# Patient Record
Sex: Male | Born: 1950
Health system: Southern US, Community
[De-identification: ages and names within clinical notes are randomized; demographics above are authoritative.]

## PROBLEM LIST (undated history)

## (undated) DIAGNOSIS — A770 Spotted fever due to Rickettsia rickettsii: Secondary | ICD-10-CM

## (undated) DIAGNOSIS — Z720 Tobacco use: Secondary | ICD-10-CM

## (undated) DIAGNOSIS — M5090 Cervical disc disorder, unspecified, unspecified cervical region: Secondary | ICD-10-CM

## (undated) HISTORY — PX: TRACHEOSTOMY: SUR1362

## (undated) HISTORY — PX: VASECTOMY: SHX75

## (undated) HISTORY — PX: HERNIA REPAIR: SHX51

## (undated) HISTORY — PX: ORCHIECTOMY: SHX2116

## (undated) SURGERY — Surgical Case
Anesthesia: *Unknown

---

## 2000-06-02 ENCOUNTER — Emergency Department (HOSPITAL_COMMUNITY): Admission: EM | Admit: 2000-06-02 | Discharge: 2000-06-02 | Payer: Self-pay | Admitting: Emergency Medicine

## 2011-09-15 ENCOUNTER — Emergency Department (HOSPITAL_BASED_OUTPATIENT_CLINIC_OR_DEPARTMENT_OTHER)
Admission: EM | Admit: 2011-09-15 | Discharge: 2011-09-15 | Disposition: A | Discharge status: Home / Self Care | Payer: 59 | Attending: Emergency Medicine | Admitting: Emergency Medicine

## 2011-09-15 ENCOUNTER — Encounter (HOSPITAL_BASED_OUTPATIENT_CLINIC_OR_DEPARTMENT_OTHER): Payer: Self-pay | Admitting: *Deleted

## 2011-09-15 DIAGNOSIS — N5089 Other specified disorders of the male genital organs: Secondary | ICD-10-CM

## 2011-09-15 DIAGNOSIS — R21 Rash and other nonspecific skin eruption: Secondary | ICD-10-CM

## 2011-09-15 DIAGNOSIS — N63 Unspecified lump in unspecified breast: Secondary | ICD-10-CM | POA: Insufficient documentation

## 2011-09-15 HISTORY — DX: Spotted fever due to Rickettsia rickettsii: A77.0

## 2011-09-15 LAB — CBC
MCH: 32.9 pg (ref 26.0–34.0)
Platelets: 235 10*3/uL (ref 150–400)
RBC: 4.56 MIL/uL (ref 4.22–5.81)
WBC: 8 10*3/uL (ref 4.0–10.5)

## 2011-09-15 LAB — URINE MICROSCOPIC-ADD ON

## 2011-09-15 LAB — URINALYSIS, ROUTINE W REFLEX MICROSCOPIC
Bilirubin Urine: NEGATIVE
Glucose, UA: NEGATIVE mg/dL
Ketones, ur: NEGATIVE mg/dL
Nitrite: NEGATIVE
pH: 6 (ref 5.0–8.0)

## 2011-09-15 LAB — DIFFERENTIAL
Eosinophils Absolute: 0.3 10*3/uL (ref 0.0–0.7)
Lymphocytes Relative: 16 % (ref 12–46)
Lymphs Abs: 1.3 10*3/uL (ref 0.7–4.0)
Neutro Abs: 5.6 10*3/uL (ref 1.7–7.7)
Neutrophils Relative %: 69 % (ref 43–77)

## 2011-09-15 LAB — COMPREHENSIVE METABOLIC PANEL
ALT: 9 U/L (ref 0–53)
Alkaline Phosphatase: 47 U/L (ref 39–117)
Chloride: 101 mEq/L (ref 96–112)
GFR calc Af Amer: >90 mL/min (ref >90)
Glucose, Bld: 133 mg/dL — ABNORMAL HIGH (ref 70–99)
Potassium: 4 mEq/L (ref 3.5–5.1)
Sodium: 139 mEq/L (ref 135–145)
Total Protein: 6.8 g/dL (ref 6.0–8.3)

## 2011-09-15 LAB — LACTIC ACID, PLASMA: Lactic Acid, Venous: 1.8 mmol/L (ref 0.5–2.2)

## 2011-09-15 LAB — APTT: aPTT: 33 seconds (ref 24–37)

## 2011-09-15 MED ORDER — CLINDAMYCIN PHOSPHATE 900 MG/50ML IV SOLN
900.0000 mg | Freq: Once | INTRAVENOUS | Status: AC
  Administered 2011-09-15: 900 mg via INTRAVENOUS
  Filled 2011-09-15: qty 50

## 2011-09-15 MED ORDER — DOXYCYCLINE HYCLATE 100 MG PO TABS: 100.0000 mg | ORAL_TABLET | Freq: Two times a day (BID) | ORAL | Status: AC

## 2011-09-15 MED ORDER — VANCOMYCIN HCL IN DEXTROSE 1-5 GM/200ML-% IV SOLN
1000.0000 mg | Freq: Once | INTRAVENOUS | Status: AC
  Administered 2011-09-15: 1000 mg via INTRAVENOUS
  Filled 2011-09-15: qty 200

## 2011-09-15 NOTE — Discharge Instructions (Signed)
Rash A rash is a change in the color or texture of your skin. There are many different types of rashes. You may have other problems that accompany your rash. CAUSES   Infections.   Allergic reactions. This can include allergies to pets or foods.   Certain medicines.   Exposure to certain chemicals, soaps, or cosmetics.   Heat.   Exposure to poisonous plants.   Tumors, both cancerous and noncancerous.  SYMPTOMS   Redness.   Scaly skin.   Itchy skin.   Dry or cracked skin.   Bumps.   Blisters.   Pain.  DIAGNOSIS  Your caregiver may do a physical exam to determine what type of rash you have. A skin sample (biopsy) may be taken and examined under a microscope. TREATMENT  Treatment depends on the type of rash you have. Your caregiver may prescribe certain medicines. For serious conditions, you may need to see a skin doctor (dermatologist). HOME CARE INSTRUCTIONS   Avoid the substance that caused your rash.   Do not scratch your rash. This can cause infection.   You may take cool baths to help stop itching.   Only take over-the-counter or prescription medicines as directed by your caregiver.   Keep all follow-up appointments as directed by your caregiver.  SEEK IMMEDIATE MEDICAL CARE IF:  You have increasing pain, swelling, or redness.   You have a fever.   You have new or severe symptoms.   You have body aches, diarrhea, or vomiting.   Your rash is not better after 3 days.  MAKE SURE YOU:  Understand these instructions.   Will watch your condition.   Will get help right away if you are not doing well or get worse.  Document Released: 03/27/2002 Document Revised: 03/26/2011 Document Reviewed: 01/19/2011 [facility] Patient Information 2012 [place], [place].Scrotal Swelling Scrotal swelling may occur on one or both sides of the scrotum. Pain may also occur with swelling. Early evaluation with your caregiver and treatment is important.  CAUSES   Injury.    Infection.   An ingrown hair or abrasion in the area.   Repeated rubbing from tight-fitting underwear.   Poor hygiene.   A weakened area in the muscles around the groin (hernia). A hernia can allow abdominal contents to push into the scrotum.   Fluid around the testicle (hydrocele).   Enlarged vein around the testicle (varicocele).   Certain medical treatments or existing conditions.   A recent genital surgery or procedure.   The spermatic cord becomes twisted in the scrotum, which cuts off blood supply (testicular torsion).   Testicular cancer.  DIAGNOSIS A physical exam and medical history may be performed. You will be asked questions about recent activity and where and when the swelling began. Further tests may be performed to confirm the diagnosis, such as an imaging test (ultrasound or MRI). TREATMENT Treatment will depend on the cause of the swelling. Treatment may include:  Self care (rest, ice, limiting activity, scrotal support).   Pain medicine.   Taking medicines to treat an infection.   Surgery or help from a specialist (urologist).  HOME CARE INSTRUCTIONS  Rest and limit activity until the swelling goes away. Lying down is the preferred position.   Put ice on the scrotum.   Put ice in a plastic bag.   Place a towel between your skin and the bag.   Leave the ice on for 15 to 20 minutes, 3 to 4 times a day for 1 to 2 days.  Place a rolled towel under the testicles for support.   Wear loose-fitting clothing or an athletic support cup for comfort.   Take all medicines as directed by your caregiver.   Perform a monthly self-exam of the scrotum and penis. Feel for changes. Ask your caregiver how to perform a monthly self-exam if you are unsure.  There are a few things you can do to help prevent injury or infection that can lead to swelling:  Wear an athletic support and protective cup during sports activities.   Practice safe sex. Wear condoms.    Follow all of your caregiver's recommendations after a surgical procedure. Discuss appropriate time frames for healing and bedrest with your surgeon.   Get vaccinated against mumps, measles, and rubella (MMR).   Use good body mechanics during activity and when lifting heavy objects. Avoid bearing down or straining.   Drink enough fluids to keep your urine clear or pale yellow. Always empty the bladder completely when urinating to avoid infection.  Finding out the results of your test Ask when your test results will be ready. Make sure you get your test results. SEEK MEDICAL CARE IF:  You have a sudden (acute) onset of pain that is persistent and not improving.   You notice a heavy feeling or fluid in the scrotum.   You have pain or burning while urinating.   You have blood in the urine or semen.   You feel a lump around the testicle.   You notice that one testicle is larger than the other (slight variation is normal).   You have a persistent dull ache or pain in the groin or scrotum.   You need instruction on performing a monthly self-exam.  SEEK IMMEDIATE MEDICAL CARE IF:  The pain does not go away or becomes severe.   You have a fever.   You have pain or vomiting that cannot be controlled.   You notice significant redness or swelling of one or both sides of the scrotum.  MAKE SURE YOU:  Understand these instructions.   Will watch your condition.   Will get help right away if you are not doing well or get worse.  Document Released: 05/09/2010 Document Revised: 03/26/2011 Document Reviewed: 05/09/2010 [facility] Patient Information 2012 [place], [place].

## 2011-09-15 NOTE — ED Provider Notes (Signed)
History     CSN: [id number]  Arrival date & time 09/15/11  1746   First MD Initiated Contact with Patient 09/15/11 1858      Chief Complaint  Patient presents with  . Skin Problem    (Consider location/radiation/quality/duration/timing/severity/associated sxs/prior treatment) HPI Comments: Patient complains of 2 to three-day history of swelling in his scrotal area. It started about 3 days ago and is currently gotten worse. Now. His entire scrotal area and penis is diffusely swollen. He's had some clear drainage from the site. Denies any pain to the area. Denies a known fevers. Yesterday. He started noticing some swelling and tenderness to his nipples with some erythema to his nipples. He's also noticed a rash that started 2 days ago as well. It's nonpruritic. Denies a known fevers. Denies he nausea, vomiting. Denies any known bites or stings other than he did pull a tick off about a week ago. Denies any cough, congestion, shortness of breath, headaches, abdominal pain, or other complaints. He has a history of swelling in his penis and scrotal area in the past  one time and was told he possibly had a spider bite. Was given antibiotics and went away. He saw Dr. Patsi Sears for this  The history is provided by the patient.    Past Medical History  Diagnosis Date  . [place] spotted fever     Past Surgical History  Procedure Date  . Tracheotomy     No family history on file.  History  Substance Use Topics  . Smoking status: Current Everyday Smoker -- 1.0 packs/day  . Smokeless tobacco: Not on file  . Alcohol Use: No      Review of Systems  Constitutional: Negative for fever, chills, diaphoresis and fatigue.  HENT: Negative for congestion, rhinorrhea and sneezing.   Eyes: Negative.   Respiratory: Negative for cough, chest tightness and shortness of breath.   Cardiovascular: Negative for chest pain and leg swelling.  Gastrointestinal: Negative for nausea, vomiting,  abdominal pain, diarrhea and blood in stool.  Genitourinary: Positive for penile swelling and scrotal swelling. Negative for frequency, hematuria, flank pain, difficulty urinating, genital sores and testicular pain.  Musculoskeletal: Negative for back pain and arthralgias.  Skin: Positive for rash.  Neurological: Negative for dizziness, speech difficulty, weakness, numbness and headaches.    Allergies  Penicillins  Home Medications   Current Outpatient Rx  Name Route Sig Dispense Refill  . DOXYCYCLINE HYCLATE 100 MG PO TABS Oral Take 1 tablet (100 mg total) by mouth 2 (two) times daily. 28 tablet 0    BP 102/79  Pulse 88  Temp(Src) 98.4 F (36.9 C) (Oral)  Resp 18  SpO2 95%  Physical Exam  Constitutional: He is oriented to person, place, and time. He appears well-developed and well-nourished.  HENT:  Head: Normocephalic and atraumatic.  Eyes: Pupils are equal, round, and reactive to light.  Neck: Normal range of motion. Neck supple.  Cardiovascular: Normal rate, regular rhythm and normal heart sounds.   Pulmonary/Chest: Effort normal and breath sounds normal. No respiratory distress. He has no wheezes. He has no rales. He exhibits no tenderness.       He has diffuse swelling to both nipples with firmness on palpation. There is no fluctuance. No obvious drainage from the nipples. There erythematous. Mild erythema around the nipples.  Abdominal: Soft. Bowel sounds are normal. There is no tenderness. There is no rebound and no guarding.  Genitourinary:       He has diffuse significant  swelling to the scrotum and the penis. He is circumcised. Swelling is diffuse. There is no tenderness to palpation. Serous drainage is noted  Musculoskeletal: Normal range of motion. He exhibits no edema.  Lymphadenopathy:    He has no cervical adenopathy.  Neurological: He is alert and oriented to person, place, and time.  Skin: Skin is warm and dry. Rash noted.       He has a diffuse macular-type  rash. He also has some petechiae to his lower extremities. No vesicular lesions are noted. No purpura is noted. No rash to the palms or soles  Psychiatric: He has a normal mood and affect.    ED Course  Procedures (including critical care time) Results for orders placed during the hospital encounter of 09/15/11  CBC      Component Value Range   WBC 8.0  4.0 - 10.5 (K/uL)   RBC 4.56  4.22 - 5.81 (MIL/uL)   Hemoglobin 15.0  13.0 - 17.0 (g/dL)   HCT 16.1  09.6 - 04.5 (%)   MCV 93.9  78.0 - 100.0 (fL)   MCH 32.9  26.0 - 34.0 (pg)   MCHC 35.0  30.0 - 36.0 (g/dL)   RDW 40.9  81.1 - 91.4 (%)   Platelets 235  150 - 400 (K/uL)  DIFFERENTIAL      Component Value Range   Neutrophils Relative 69  43 - 77 (%)   Neutro Abs 5.6  1.7 - 7.7 (K/uL)   Lymphocytes Relative 16  12 - 46 (%)   Lymphs Abs 1.3  0.7 - 4.0 (K/uL)   Monocytes Relative 10  3 - 12 (%)   Monocytes Absolute 0.8  0.1 - 1.0 (K/uL)   Eosinophils Relative 3  0 - 5 (%)   Eosinophils Absolute 0.3  0.0 - 0.7 (K/uL)   Basophils Relative 0  0 - 1 (%)   Basophils Absolute 0.0  0.0 - 0.1 (K/uL)  COMPREHENSIVE METABOLIC PANEL      Component Value Range   Sodium 139  135 - 145 (mEq/L)   Potassium 4.0  3.5 - 5.1 (mEq/L)   Chloride 101  96 - 112 (mEq/L)   CO2 29  19 - 32 (mEq/L)   Glucose, Bld 133 (*) 70 - 99 (mg/dL)   BUN 12  6 - 23 (mg/dL)   Creatinine, Ser 7.82  0.50 - 1.35 (mg/dL)   Calcium 9.6  8.4 - 95.6 (mg/dL)   Total Protein 6.8  6.0 - 8.3 (g/dL)   Albumin 3.8  3.5 - 5.2 (g/dL)   AST 19  0 - 37 (U/L)   ALT 9  0 - 53 (U/L)   Alkaline Phosphatase 47  39 - 117 (U/L)   Total Bilirubin 0.3  0.3 - 1.2 (mg/dL)   GFR calc non Af Amer >90  >90 (mL/min)   GFR calc Af Amer >90  >90 (mL/min)  URINALYSIS, ROUTINE W REFLEX MICROSCOPIC      Component Value Range   Color, Urine YELLOW  YELLOW    APPearance CLEAR  CLEAR    Specific Gravity, Urine 1.025  1.005 - 1.030    pH 6.0  5.0 - 8.0    Glucose, UA NEGATIVE  NEGATIVE (mg/dL)    Hgb urine dipstick NEGATIVE  NEGATIVE    Bilirubin Urine NEGATIVE  NEGATIVE    Ketones, ur NEGATIVE  NEGATIVE (mg/dL)   Protein, ur NEGATIVE  NEGATIVE (mg/dL)   Urobilinogen, UA 1.0  0.0 - 1.0 (mg/dL)  Nitrite NEGATIVE  NEGATIVE    Leukocytes, UA TRACE (*) NEGATIVE   PROTIME-INR      Component Value Range   Prothrombin Time 14.0  11.6 - 15.2 (seconds)   INR 1.06  0.00 - 1.49   APTT      Component Value Range   aPTT 33  24 - 37 (seconds)  LACTIC ACID, PLASMA      Component Value Range   Lactic Acid, Venous 1.8  0.5 - 2.2 (mmol/L)  URINE MICROSCOPIC-ADD ON      Component Value Range   Squamous Epithelial / LPF RARE  RARE    WBC, UA 0-2  <3 (WBC/hpf)   Bacteria, UA RARE  RARE    No results found.   1. Rash   2. Scrotal swelling       MDM  Pt has a bizarre constellation of symptoms including diffuse scrotal swelling with penile swelling, rash, and nipple swelling/discharge, similar to mastitis.  No pain to srotum suggestive of necrotizing fasciitis or torsion.  No other edema to suggest anasarca.  Pt has similar episode about 2 years ago, saw Dr. Patsi Sears and was given shot of abx, he had a rash similar to todays rash that developed after that which he attributed to PCN, but in retrospect seems similar to this episode.  Pt is not febrile, no elevated WBC, no evidence of sepsis.  Well appearing.  I spoke with Dr. Patsi Sears who did not really know what to make of other symptoms, but can f/u pt regarding scrotal swelling.  I spoke with Dr. Tye [place] with infectious disease who was not sure what was going on, but will f/u pt in office.  Not sure if this is infectious or an autoimmune dz.  Will tx for RMSF pending titer.  Encouraged pt to f/u with Dr. Patsi Sears and ID.  If ID does not make appt within 2 days, advised pt to f/u with his PMD at [facility] in [place].  Advised to return here if symptoms worsen at all.        Rolan Bucco, MD 09/15/11 2202

## 2011-09-15 NOTE — ED Notes (Signed)
Dr Fredderick Phenix at bedside discussing test results and f/u care with pt.

## 2011-09-15 NOTE — ED Notes (Signed)
Rash over his body x 3 days. Infection to his nipples and groin.

## 2011-09-17 ENCOUNTER — Encounter: Payer: Self-pay | Admitting: Internal Medicine

## 2011-09-17 ENCOUNTER — Ambulatory Visit (INDEPENDENT_AMBULATORY_CARE_PROVIDER_SITE_OTHER): Payer: 59 | Admitting: Internal Medicine

## 2011-09-17 VITALS — BP 136/86 | HR 79 | Temp 98.6°F | Ht 76.5 in | Wt 197.0 lb

## 2011-09-17 DIAGNOSIS — R21 Rash and other nonspecific skin eruption: Secondary | ICD-10-CM

## 2011-09-17 LAB — ROCKY MTN SPOTTED FVR AB, IGM-BLOOD: RMSF IgM: 0.3 IV (ref 0.00–0.89)

## 2011-09-17 LAB — ROCKY MTN SPOTTED FVR AB, IGG-BLOOD: RMSF IgG: 7.72 IV — ABNORMAL HIGH

## 2011-09-17 NOTE — Progress Notes (Signed)
  Subjective:    Patient ID: Jeremiah Carson, male    DOB: 05-22-1950, 61 y.o.   MRN: [id number]  HPI Evaluation of a rash. He was seen in the emergency room 2 days ago with concern of a diffuse rash and also notable scrotal swelling. He also had a similar constellation of symptoms about 2 years ago and at that time was seen by his urologist. He did not know the diagnosis but thought he might have had an infection of some point and in fact was given a dose of IM penicillin and it resolved all his symptoms. This time he had scrotal and penile swelling that is nontender and no discharge. He had some mild nipple swelling with a surrounding macular rash. He also now has a rash that is pinpoint spread throughout his body including his palms and his feet. He works as a [employer]. He was given a course of doxycycline by the emergency room though there is no indication of what this was being used for. There is no signs of infection by the labs and there is no syphilis test done by the emergency room. He also has appointment with his urologist today. He denies fever, denies headache, denies dysuria.   Review of Systems  Constitutional: Negative.   HENT: Negative for sore throat and trouble swallowing.   Cardiovascular: Negative for chest pain, palpitations and leg swelling.  Gastrointestinal: Negative for nausea, vomiting, abdominal pain and diarrhea.  Genitourinary: Positive for penile swelling and scrotal swelling. Negative for hematuria, discharge, genital sores and penile pain.       Penile and scrotal swelling, nontender, no rash or discharge or  Musculoskeletal: Negative.   Skin:       Diffuse maculopapular rash including on the palms Over the nipples she does have surrounding erythema and palpable lymphadenopathy  Neurological: Negative.   Hematological: Negative for adenopathy.       Objective:   Physical Exam  Constitutional: He appears well-developed and well-nourished. No distress.    Cardiovascular: Normal rate, regular rhythm and normal heart sounds.  Exam reveals no gallop and no friction rub.   No murmur heard. Pulmonary/Chest: Effort normal and breath sounds normal. No respiratory distress. He has no wheezes. He has no rales.  Abdominal: Soft. Bowel sounds are normal. He exhibits no distension. There is no tenderness. There is no rebound.  Genitourinary:       Large scrotal and penile swelling  Skin:       Diffuse pressure cuff with her rash including the palms and surrounding the nipple.  Psychiatric: He has a normal mood and affect. His behavior is normal.          Assessment & Plan:

## 2011-09-17 NOTE — Assessment & Plan Note (Addendum)
A diffuse rash with penile swelling in history of treatment with penicillin and resolution of symptoms certainly brings up the concern of syphilis. He is on doxycycline for an unknown reason though this certainly will treat secondary syphilis in somebody with a penicillin allergy. There was no syphilis testing done in the emergency room however I will send that today including gonorrhea, Chlamydia and HIV. He is to see the urologist today as well. If these tests are negative, I would defer any other management to the urologist. He may need a scrotal ultrasound but since he is seen the urologist I will defer this I do not think this is [facility] spotted fever as it is no tick exposure, no fever, no headache.

## 2011-09-18 NOTE — ED Notes (Signed)
+  [place] Spotted Fever. Patient treated with Doxycycline. Per protocol MD. [facility] faxed.

## 2011-09-22 LAB — CULTURE, BLOOD (ROUTINE X 2): Culture  Setup Time: 201305290156

## 2014-01-11 ENCOUNTER — Other Ambulatory Visit: Payer: Self-pay | Admitting: Family Medicine

## 2014-01-11 ENCOUNTER — Ambulatory Visit
Admission: RE | Admit: 2014-01-11 | Discharge: 2014-01-11 | Disposition: A | Discharge status: Home / Self Care | Payer: 59 | Source: Ambulatory Visit | Attending: Family Medicine | Admitting: Family Medicine

## 2014-01-11 DIAGNOSIS — M5412 Radiculopathy, cervical region: Secondary | ICD-10-CM

## 2014-01-11 DIAGNOSIS — R29898 Other symptoms and signs involving the musculoskeletal system: Secondary | ICD-10-CM

## 2014-01-12 ENCOUNTER — Other Ambulatory Visit: Payer: Self-pay | Admitting: Family Medicine

## 2014-01-12 DIAGNOSIS — M5412 Radiculopathy, cervical region: Secondary | ICD-10-CM

## 2014-01-19 ENCOUNTER — Ambulatory Visit
Admission: RE | Admit: 2014-01-19 | Discharge: 2014-01-19 | Disposition: A | Discharge status: Home / Self Care | Payer: 59 | Source: Ambulatory Visit | Attending: Family Medicine | Admitting: Family Medicine

## 2014-01-19 DIAGNOSIS — M5412 Radiculopathy, cervical region: Secondary | ICD-10-CM

## 2014-01-19 MED ORDER — GADOBENATE DIMEGLUMINE 529 MG/ML IV SOLN
18.0000 mL | Freq: Once | INTRAVENOUS | Status: AC | PRN
  Administered 2014-01-19: 18 mL via INTRAVENOUS

## 2016-05-24 ENCOUNTER — Inpatient Hospital Stay (HOSPITAL_COMMUNITY)
Admission: EM | Admit: 2016-05-24 | Discharge: 2016-05-28 | DRG: 871 | Disposition: A | Discharge status: Home / Self Care | Payer: 59 | Attending: Family Medicine | Admitting: Family Medicine

## 2016-05-24 ENCOUNTER — Encounter (HOSPITAL_COMMUNITY)
Admission: EM | Disposition: A | Discharge status: Home / Self Care | Payer: Self-pay | Source: Home / Self Care | Attending: Family Medicine

## 2016-05-24 ENCOUNTER — Emergency Department (HOSPITAL_COMMUNITY): Payer: 59

## 2016-05-24 ENCOUNTER — Encounter (HOSPITAL_COMMUNITY): Payer: Self-pay | Admitting: Emergency Medicine

## 2016-05-24 DIAGNOSIS — I214 Non-ST elevation (NSTEMI) myocardial infarction: Secondary | ICD-10-CM

## 2016-05-24 DIAGNOSIS — J069 Acute upper respiratory infection, unspecified: Secondary | ICD-10-CM | POA: Diagnosis present

## 2016-05-24 DIAGNOSIS — R350 Frequency of micturition: Secondary | ICD-10-CM | POA: Diagnosis present

## 2016-05-24 DIAGNOSIS — I4892 Unspecified atrial flutter: Secondary | ICD-10-CM | POA: Diagnosis present

## 2016-05-24 DIAGNOSIS — I248 Other forms of acute ischemic heart disease: Secondary | ICD-10-CM | POA: Diagnosis present

## 2016-05-24 DIAGNOSIS — A419 Sepsis, unspecified organism: Secondary | ICD-10-CM | POA: Diagnosis present

## 2016-05-24 DIAGNOSIS — J189 Pneumonia, unspecified organism: Secondary | ICD-10-CM | POA: Diagnosis present

## 2016-05-24 DIAGNOSIS — R509 Fever, unspecified: Secondary | ICD-10-CM

## 2016-05-24 DIAGNOSIS — Z720 Tobacco use: Secondary | ICD-10-CM

## 2016-05-24 DIAGNOSIS — R000 Tachycardia, unspecified: Secondary | ICD-10-CM | POA: Diagnosis present

## 2016-05-24 DIAGNOSIS — J441 Chronic obstructive pulmonary disease with (acute) exacerbation: Secondary | ICD-10-CM | POA: Diagnosis present

## 2016-05-24 DIAGNOSIS — I4891 Unspecified atrial fibrillation: Secondary | ICD-10-CM | POA: Diagnosis present

## 2016-05-24 DIAGNOSIS — Z801 Family history of malignant neoplasm of trachea, bronchus and lung: Secondary | ICD-10-CM | POA: Diagnosis not present

## 2016-05-24 DIAGNOSIS — Z91199 Patient's noncompliance with other medical treatment and regimen due to unspecified reason: Secondary | ICD-10-CM

## 2016-05-24 DIAGNOSIS — R0902 Hypoxemia: Secondary | ICD-10-CM | POA: Diagnosis present

## 2016-05-24 DIAGNOSIS — R0600 Dyspnea, unspecified: Secondary | ICD-10-CM | POA: Diagnosis not present

## 2016-05-24 DIAGNOSIS — R0602 Shortness of breath: Secondary | ICD-10-CM

## 2016-05-24 DIAGNOSIS — R197 Diarrhea, unspecified: Secondary | ICD-10-CM | POA: Diagnosis present

## 2016-05-24 DIAGNOSIS — Z880 Allergy status to penicillin: Secondary | ICD-10-CM | POA: Diagnosis not present

## 2016-05-24 DIAGNOSIS — K921 Melena: Secondary | ICD-10-CM | POA: Diagnosis present

## 2016-05-24 DIAGNOSIS — F17210 Nicotine dependence, cigarettes, uncomplicated: Secondary | ICD-10-CM | POA: Diagnosis present

## 2016-05-24 DIAGNOSIS — A770 Spotted fever due to Rickettsia rickettsii: Secondary | ICD-10-CM | POA: Diagnosis present

## 2016-05-24 DIAGNOSIS — I959 Hypotension, unspecified: Secondary | ICD-10-CM | POA: Diagnosis not present

## 2016-05-24 DIAGNOSIS — Z9119 Patient's noncompliance with other medical treatment and regimen: Secondary | ICD-10-CM | POA: Diagnosis not present

## 2016-05-24 HISTORY — DX: Cervical disc disorder, unspecified, unspecified cervical region: M50.90

## 2016-05-24 HISTORY — DX: Tobacco use: Z72.0

## 2016-05-24 LAB — CBC
HCT: 38.6 % — ABNORMAL LOW (ref 39.0–52.0)
Hemoglobin: 12.3 g/dL — ABNORMAL LOW (ref 13.0–17.0)
MCH: 31.1 pg (ref 26.0–34.0)
MCHC: 31.9 g/dL (ref 30.0–36.0)
MCV: 97.7 fL (ref 78.0–100.0)
PLATELETS: 344 10*3/uL (ref 150–400)
RBC: 3.95 MIL/uL — ABNORMAL LOW (ref 4.22–5.81)
RDW: 13 % (ref 11.5–15.5)
WBC: 18.4 10*3/uL — AB (ref 4.0–10.5)

## 2016-05-24 LAB — CBC WITH DIFFERENTIAL/PLATELET
Basophils Absolute: 0 10*3/uL (ref 0.0–0.1)
Basophils Relative: 0 %
EOS ABS: 0 10*3/uL (ref 0.0–0.7)
Eosinophils Relative: 0 %
HCT: 41.6 % (ref 39.0–52.0)
HEMOGLOBIN: 13.1 g/dL (ref 13.0–17.0)
LYMPHS PCT: 8 %
Lymphs Abs: 1.4 10*3/uL (ref 0.7–4.0)
MCH: 30.9 pg (ref 26.0–34.0)
MCHC: 31.5 g/dL (ref 30.0–36.0)
MCV: 98.1 fL (ref 78.0–100.0)
MONO ABS: 1.7 10*3/uL — AB (ref 0.1–1.0)
Monocytes Relative: 10 %
NEUTROS ABS: 13.9 10*3/uL — AB (ref 1.7–7.7)
Neutrophils Relative %: 82 %
PLATELETS: 342 10*3/uL (ref 150–400)
RBC: 4.24 MIL/uL (ref 4.22–5.81)
RDW: 13 % (ref 11.5–15.5)
WBC: 17 10*3/uL — ABNORMAL HIGH (ref 4.0–10.5)

## 2016-05-24 LAB — CREATININE, SERUM: CREATININE: 0.78 mg/dL (ref 0.61–1.24)

## 2016-05-24 LAB — COMPREHENSIVE METABOLIC PANEL WITH GFR
ALT: 19 U/L (ref 17–63)
AST: 27 U/L (ref 15–41)
Albumin: 3.1 g/dL — ABNORMAL LOW (ref 3.5–5.0)
Alkaline Phosphatase: 62 U/L (ref 38–126)
Anion gap: 13 (ref 5–15)
BUN: 16 mg/dL (ref 6–20)
CO2: 31 mmol/L (ref 22–32)
Calcium: 9.2 mg/dL (ref 8.9–10.3)
Chloride: 91 mmol/L — ABNORMAL LOW (ref 101–111)
Creatinine, Ser: 0.79 mg/dL (ref 0.61–1.24)
GFR calc Af Amer: >60 mL/min
GFR calc non Af Amer: >60 mL/min
Glucose, Bld: 127 mg/dL — ABNORMAL HIGH (ref 65–99)
Potassium: 4.1 mmol/L (ref 3.5–5.1)
Sodium: 135 mmol/L (ref 135–145)
Total Bilirubin: 0.6 mg/dL (ref 0.3–1.2)
Total Protein: 6.9 g/dL (ref 6.5–8.1)

## 2016-05-24 LAB — URINALYSIS, ROUTINE W REFLEX MICROSCOPIC
Bacteria, UA: NONE SEEN
Bilirubin Urine: NEGATIVE
Glucose, UA: NEGATIVE mg/dL
Hgb urine dipstick: NEGATIVE
Ketones, ur: 5 mg/dL — AB
Leukocytes, UA: NEGATIVE
Nitrite: NEGATIVE
Protein, ur: 100 mg/dL — AB
Specific Gravity, Urine: 1.029 (ref 1.005–1.030)
Squamous Epithelial / HPF: NONE SEEN
pH: 5 (ref 5.0–8.0)

## 2016-05-24 LAB — BRAIN NATRIURETIC PEPTIDE: B NATRIURETIC PEPTIDE 5: 268.3 pg/mL — AB (ref 0.0–100.0)

## 2016-05-24 LAB — I-STAT ARTERIAL BLOOD GAS, ED
ACID-BASE EXCESS: 8 mmol/L — AB (ref 0.0–2.0)
BICARBONATE: 33.4 mmol/L — AB (ref 20.0–28.0)
O2 Saturation: 94 %
PCO2 ART: 50.3 mmHg — AB (ref 32.0–48.0)
PH ART: 7.43 (ref 7.350–7.450)
PO2 ART: 71 mmHg — AB (ref 83.0–108.0)
Patient temperature: 99
TCO2: 35 mmol/L (ref 0–100)

## 2016-05-24 LAB — TSH: TSH: 0.437 u[IU]/mL (ref 0.350–4.500)

## 2016-05-24 LAB — MAGNESIUM: Magnesium: 1.7 mg/dL (ref 1.7–2.4)

## 2016-05-24 LAB — I-STAT CG4 LACTIC ACID, ED: Lactic Acid, Venous: 1.52 mmol/L (ref 0.5–1.9)

## 2016-05-24 LAB — TROPONIN I
TROPONIN I: 0.05 ng/mL — AB (ref <0.03)
Troponin I: 0.07 ng/mL (ref <0.03)

## 2016-05-24 LAB — MRSA PCR SCREENING: MRSA by PCR: NEGATIVE

## 2016-05-24 LAB — LACTIC ACID, PLASMA: LACTIC ACID, VENOUS: 1.5 mmol/L (ref 0.5–1.9)

## 2016-05-24 LAB — INFLUENZA PANEL BY PCR (TYPE A & B)
INFLAPCR: NEGATIVE
INFLBPCR: NEGATIVE

## 2016-05-24 LAB — PHOSPHORUS: Phosphorus: 3.8 mg/dL (ref 2.5–4.6)

## 2016-05-24 SURGERY — LEFT HEART CATH AND CORONARY ANGIOGRAPHY
Anesthesia: LOCAL

## 2016-05-24 MED ORDER — ENOXAPARIN SODIUM 80 MG/0.8ML ~~LOC~~ SOLN
80.0000 mg | Freq: Once | SUBCUTANEOUS | Status: AC
  Administered 2016-05-24: 18:00:00 80 mg via SUBCUTANEOUS
  Filled 2016-05-24: qty 0.8

## 2016-05-24 MED ORDER — SODIUM CHLORIDE 0.9 % IV BOLUS (SEPSIS): 1000.0000 mL | Freq: Once | INTRAVENOUS | Status: DC

## 2016-05-24 MED ORDER — AMIODARONE HCL IN DEXTROSE 360-4.14 MG/200ML-% IV SOLN
30.0000 mg/h | INTRAVENOUS | Status: DC
  Administered 2016-05-25 – 2016-05-26 (×3): 30 mg/h via INTRAVENOUS
  Filled 2016-05-24 (×4): qty 200

## 2016-05-24 MED ORDER — IPRATROPIUM-ALBUTEROL 0.5-2.5 (3) MG/3ML IN SOLN
3.0000 mL | Freq: Once | RESPIRATORY_TRACT | Status: AC
  Administered 2016-05-24: 3 mL via RESPIRATORY_TRACT
  Filled 2016-05-24: qty 3

## 2016-05-24 MED ORDER — SODIUM CHLORIDE 0.9 % IV BOLUS (SEPSIS)
1000.0000 mL | Freq: Once | INTRAVENOUS | Status: AC
  Administered 2016-05-24: 1000 mL via INTRAVENOUS

## 2016-05-24 MED ORDER — AMIODARONE LOAD VIA INFUSION
150.0000 mg | Freq: Once | INTRAVENOUS | Status: AC
  Administered 2016-05-24: 150 mg via INTRAVENOUS
  Filled 2016-05-24: qty 83.34

## 2016-05-24 MED ORDER — NITROGLYCERIN 1 MG/10 ML FOR IR/CATH LAB
INTRA_ARTERIAL | Status: AC
  Filled 2016-05-24: qty 10

## 2016-05-24 MED ORDER — VANCOMYCIN HCL IN DEXTROSE 1-5 GM/200ML-% IV SOLN: 1000.0000 mg | Freq: Once | INTRAVENOUS | Status: DC

## 2016-05-24 MED ORDER — DILTIAZEM HCL 25 MG/5ML IV SOLN: 10.0000 mg | Freq: Once | INTRAVENOUS | Status: DC

## 2016-05-24 MED ORDER — ENOXAPARIN SODIUM 40 MG/0.4ML ~~LOC~~ SOLN: 40.0000 mg | SUBCUTANEOUS | Status: DC

## 2016-05-24 MED ORDER — METOPROLOL TARTRATE 25 MG PO TABS
25.0000 mg | ORAL_TABLET | Freq: Two times a day (BID) | ORAL | Status: DC
  Administered 2016-05-24 – 2016-05-28 (×7): 25 mg via ORAL
  Filled 2016-05-24 (×9): qty 1

## 2016-05-24 MED ORDER — VERAPAMIL HCL 2.5 MG/ML IV SOLN
INTRAVENOUS | Status: AC
  Filled 2016-05-24: qty 2

## 2016-05-24 MED ORDER — HEPARIN (PORCINE) IN NACL 100-0.45 UNIT/ML-% IJ SOLN: 14.0000 [IU]/kg/h | INTRAMUSCULAR | Status: DC

## 2016-05-24 MED ORDER — PREDNISONE 50 MG PO TABS
50.0000 mg | ORAL_TABLET | Freq: Every day | ORAL | Status: DC
  Administered 2016-05-25 – 2016-05-28 (×4): 50 mg via ORAL
  Filled 2016-05-24 (×4): qty 1

## 2016-05-24 MED ORDER — LEVOFLOXACIN IN D5W 750 MG/150ML IV SOLN: 750.0000 mg | Freq: Once | INTRAVENOUS | Status: DC

## 2016-05-24 MED ORDER — DILTIAZEM HCL 25 MG/5ML IV SOLN
20.0000 mg | Freq: Once | INTRAVENOUS | Status: AC
  Administered 2016-05-24: 20 mg via INTRAVENOUS
  Filled 2016-05-24: qty 5

## 2016-05-24 MED ORDER — HEPARIN (PORCINE) IN NACL 100-0.45 UNIT/ML-% IJ SOLN
14.0000 [IU]/kg/h | INTRAMUSCULAR | Status: DC
  Administered 2016-05-25: 14 [IU]/kg/h via INTRAVENOUS
  Filled 2016-05-24: qty 250

## 2016-05-24 MED ORDER — OSELTAMIVIR PHOSPHATE 75 MG PO CAPS
75.0000 mg | ORAL_CAPSULE | Freq: Once | ORAL | Status: AC
  Administered 2016-05-24: 75 mg via ORAL
  Filled 2016-05-24: qty 1

## 2016-05-24 MED ORDER — VANCOMYCIN HCL IN DEXTROSE 1-5 GM/200ML-% IV SOLN
1000.0000 mg | Freq: Once | INTRAVENOUS | Status: AC
  Administered 2016-05-24: 1000 mg via INTRAVENOUS
  Filled 2016-05-24: qty 200

## 2016-05-24 MED ORDER — VANCOMYCIN HCL IN DEXTROSE 1-5 GM/200ML-% IV SOLN
1000.0000 mg | Freq: Three times a day (TID) | INTRAVENOUS | Status: DC
  Administered 2016-05-25 – 2016-05-26 (×4): 1000 mg via INTRAVENOUS
  Filled 2016-05-24 (×6): qty 200

## 2016-05-24 MED ORDER — DILTIAZEM HCL 100 MG IV SOLR
5.0000 mg/h | Freq: Once | INTRAVENOUS | Status: DC
  Filled 2016-05-24: qty 100

## 2016-05-24 MED ORDER — IOPAMIDOL (ISOVUE-370) INJECTION 76%
INTRAVENOUS | Status: AC
  Filled 2016-05-24: qty 125

## 2016-05-24 MED ORDER — HEPARIN BOLUS VIA INFUSION
3000.0000 [IU] | Freq: Once | INTRAVENOUS | Status: DC
  Filled 2016-05-24: qty 3000

## 2016-05-24 MED ORDER — LEVOFLOXACIN IN D5W 750 MG/150ML IV SOLN
750.0000 mg | INTRAVENOUS | Status: DC
  Administered 2016-05-25 – 2016-05-26 (×2): 750 mg via INTRAVENOUS
  Filled 2016-05-24 (×2): qty 150

## 2016-05-24 MED ORDER — LEVOFLOXACIN IN D5W 750 MG/150ML IV SOLN
750.0000 mg | Freq: Once | INTRAVENOUS | Status: AC
  Administered 2016-05-24: 750 mg via INTRAVENOUS
  Filled 2016-05-24: qty 150

## 2016-05-24 MED ORDER — ALBUTEROL SULFATE (2.5 MG/3ML) 0.083% IN NEBU: 2.5000 mg | INHALATION_SOLUTION | RESPIRATORY_TRACT | Status: DC | PRN

## 2016-05-24 MED ORDER — METHYLPREDNISOLONE SODIUM SUCC 125 MG IJ SOLR
125.0000 mg | Freq: Once | INTRAMUSCULAR | Status: AC
  Administered 2016-05-24: 125 mg via INTRAVENOUS
  Filled 2016-05-24: qty 2

## 2016-05-24 MED ORDER — AMIODARONE HCL IN DEXTROSE 360-4.14 MG/200ML-% IV SOLN
60.0000 mg/h | INTRAVENOUS | Status: AC
  Administered 2016-05-24 (×2): 60 mg/h via INTRAVENOUS
  Filled 2016-05-24: qty 200

## 2016-05-24 MED ORDER — ACETAMINOPHEN 500 MG PO TABS
1000.0000 mg | ORAL_TABLET | Freq: Once | ORAL | Status: AC
  Administered 2016-05-24: 1000 mg via ORAL
  Filled 2016-05-24: qty 2

## 2016-05-24 MED ORDER — HEPARIN (PORCINE) IN NACL 2-0.9 UNIT/ML-% IJ SOLN
INTRAMUSCULAR | Status: AC
  Filled 2016-05-24: qty 1500

## 2016-05-24 NOTE — Progress Notes (Signed)
Attending Brief Admission Note  Patient seen and examined at 5p. Briefly, 66 y.o. male with history of tobacco abuse (recently quit) presenting with complaint of flulike symptoms, found to be very tachycardic with afib/RVR. Initially came in with concern for STEMI, though per STEMI consulting cardiologist this was not the case.  1. Afib with RVR - Found to be tachycardic in afib/flutter. Put on dilt drip in the ED with blood pressure subsequently dropping. Formal consult to cardiology, check echo, cycle troponins. May need digoxin or other agent to control rate (or may improve with more IVF). Will need to discuss anticoagulation risks/benefits. Got lovenox SQ in ED.  2. Flulike symptoms with sepsis - cough, sputum production, weakness, diarrhea, nausea, poor PO intake. Low grade temp in the ED, elevated WBC count, tachycardia. Flu negative, though sensitivity of this test is not great. Agree with empiric tamiflu given symptoms. Cover with vanc/levaquin for now for presumed respiratory source of sepsis and PCN allergy. Follow cultures, trend lactate. Mild wheezing noted on exam - with prolonged smoking history will add prednisone for likely COPD (received IV solumedrol in ED).  3. Blood in stool - endorsed by patient. Trend hgb. Recommend rectal exam. Outpatient colonoscopy as long as no major bleeding here in the hospital.  4. Reported hematuria - check UA, monitor  Will cosign resident H&P when it is available.  Levert FeinsteinBrittany Lashaun Poch, MD [facility]

## 2016-05-24 NOTE — Progress Notes (Signed)
Pharmacy Antibiotic Note  Jeremiah Carson is a 66 y.o. male admitted on 05/24/2016 presenting with complaint of flulike symptoms, found to be very tachycardic with afib/RVR.   Pharmacy has been consulted for vancomycin and levofloxacin dosing for sepsis/CAP. SCr 0.78, CrCl ~ >100 ml/min , WBC 18.4 , afebrile, Tm 100.2 Levaquin 750 mg IV x1 given 2/4 tonight @ 18:35 Goal Vanc Trough =15-20 mcg/ml  Plan: Levofloxacin 750mg  IV q24h  Vancomyicn 1000 mg IV q8h Monitor clinical stauts, renal function, culture results. Steady state vanc trough.    Height: 6\' 4"  (193 cm) Weight: 190 lb 11.2 oz (86.5 kg) IBW/kg (Calculated) : 86.8  Temp (24hrs), Avg:99.6 F (37.6 C), Min:98.5 F (36.9 C), Max:100.2 F (37.9 C)   Recent Labs Lab 05/24/16 1429 05/24/16 1434 05/24/16 1808  WBC 17.0*  --  18.4*  CREATININE 0.79  --  0.78  LATICACIDVEN  --  1.52  --     Estimated Creatinine Clearance: 115.6 mL/min (by C-G formula based on SCr of 0.78 mg/dL).    Allergies  Allergen Reactions  . Penicillins Anaphylaxis, Hives and Rash    Has patient had a PCN reaction causing immediate rash, facial/tongue/throat swelling, SOB or lightheadedness with hypotension: No Has patient had a PCN reaction causing severe rash involving mucus membranes or skin necrosis: Unk Has patient had a PCN reaction that required hospitalization: Unk Has patient had a PCN reaction occurring within the last 10 years: No If all of the above answers are "NO", then may proceed with Cephalosporin use.     Antimicrobials this admission:  Vancomycin 2/4>> Levofloxacin 2/4>>  Dose adjustments this admission:   Microbiology results: 2/4 BCx: sent  2/4 resp panel:  2/4 MRSA PCR:  Thank you for allowing pharmacy to be a part of this patient's care. Noah Delaineuth Ilea Hilton, RPh Clinical Pharmacist Pager: [phone] 05/24/2016 7:38 PM

## 2016-05-24 NOTE — ED Provider Notes (Signed)
[facility] DEPT Provider Note   CSN: [id number] Arrival date & time: 05/24/16  1344     History   Chief Complaint Chief Complaint  Patient presents with  . Shortness of Breath  . Atrial Flutter    HPI Jeremiah Carson is a 66 y.o. male.  Pt presents to the ED with SOB.  The pt has had a cough for 2 weeks.  He felt his heart going fast starting yesterday.  He called EMS today.  EMS said RA O2 sat is 82%.  The pt was given ASA and placed on a NRB.  EMS called a code stemi due to concerns over elevation in the lateral leads.  HR was 185.  No meds given for HR.  Pt d/w cardiology upon pt's arrival.  No STEMI.  STEMI alert cancelled.      Past Medical History:  Diagnosis Date  . Rocky Mountain spotted fever     Patient Active Problem List   Diagnosis Date Noted  . Sepsis ([place]) 05/24/2016  . Rash 09/17/2011    Past Surgical History:  Procedure Laterality Date  . tracheotomy         Home Medications    Prior to Admission medications   Medication Sig Start Date End Date Taking? Authorizing Provider  Pseudoephedrine-DM-GG-APAP (ROBITUSSIN COLD/COUGH/FLU PO) Take 5-10 mLs by mouth every 8 (eight) hours as needed (for flu-like or cold symptoms).   Yes Historical Provider, MD    Family History No family history on file.  Social History Social History  Substance Use Topics  . Smoking status: Current Every Day Smoker    Packs/day: 1.00    Years: 35.00  . Smokeless tobacco: Never Used  . Alcohol use No     Allergies   Penicillins   Review of Systems Review of Systems  Respiratory: Positive for cough, shortness of breath and wheezing.   All other systems reviewed and are negative.    Physical Exam Updated Vital Signs BP 100/63   Pulse (!) 121   Temp 100.2 F (37.9 C) (Oral)   Resp (!) 29   SpO2 94%   Physical Exam  Constitutional: He is oriented to person, place, and time. He appears well-developed. He appears distressed.  HENT:  Head:  Normocephalic and atraumatic.  Right Ear: External ear normal.  Left Ear: External ear normal.  Nose: Nose normal.  Mouth/Throat: Oropharynx is clear and moist.  Eyes: Conjunctivae and EOM are normal. Pupils are equal, round, and reactive to light.  Neck: Normal range of motion. Neck supple.  Cardiovascular: An irregular rhythm present. Tachycardia present.   Pulmonary/Chest: Tachypnea noted. He has wheezes. He has rales.  Abdominal: Soft. Bowel sounds are normal.  Musculoskeletal: Normal range of motion.  Neurological: He is alert and oriented to person, place, and time.  Skin: Skin is warm.  Psychiatric: He has a normal mood and affect. His behavior is normal. Judgment and thought content normal.     ED Treatments / Results  Labs (all labs ordered are listed, but only abnormal results are displayed) Labs Reviewed  COMPREHENSIVE METABOLIC PANEL - Abnormal; Notable for the following:       Result Value   Chloride 91 (*)    Glucose, Bld 127 (*)    Albumin 3.1 (*)    All other components within normal limits  CBC WITH DIFFERENTIAL/PLATELET - Abnormal; Notable for the following:    WBC 17.0 (*)    Neutro Abs 13.9 (*)    Monocytes Absolute  1.7 (*)    All other components within normal limits  TROPONIN I - Abnormal; Notable for the following:    Troponin I 0.05 (*)    All other components within normal limits  BRAIN NATRIURETIC PEPTIDE - Abnormal; Notable for the following:    B Natriuretic Peptide 268.3 (*)    All other components within normal limits  I-STAT ARTERIAL BLOOD GAS, ED - Abnormal; Notable for the following:    pCO2 arterial 50.3 (*)    pO2, Arterial 71.0 (*)    Bicarbonate 33.4 (*)    Acid-Base Excess 8.0 (*)    All other components within normal limits  CULTURE, BLOOD (ROUTINE X 2)  CULTURE, BLOOD (ROUTINE X 2)  URINALYSIS, ROUTINE W REFLEX MICROSCOPIC  INFLUENZA PANEL BY PCR (TYPE A & B)  I-STAT CG4 LACTIC ACID, ED    EKG  EKG  Interpretation  Date/Time:  Sunday May 24 2016 14:01:43 EST Ventricular Rate:  129 PR Interval:    QRS Duration: 91 QT Interval:  320 QTC Calculation: 469 R Axis:   77 Text Interpretation:  Atrial fibrillation Paired ventricular premature complexes Nonspecific T abnrm, anterolateral leads Confirmed by Gilford Raid MD, Maedell Hedger ([id number]) on 05/24/2016 2:19:44 PM       Radiology Dg Chest Port 1 View  Result Date: 05/24/2016 CLINICAL DATA:  66 year old male with shortness of breath and cough for 2 weeks. Delirium yesterday. Initial encounter. EXAM: PORTABLE CHEST 1 VIEW COMPARISON:  None FINDINGS: Portable AP upright view at 1408 hours. Pulmonary hyperinflation suspected. Allowing for portable technique the lungs are clear. No pneumothorax or pleural effusion. Normal cardiac size and mediastinal contours. Visualized tracheal air column is within normal limits. Negative visible bowel gas pattern. IMPRESSION: No acute cardiopulmonary abnormality. Pulmonary hyperinflation suspected. Electronically Signed   By: Genevie Ann M.D.   On: 05/24/2016 14:20    Procedures Procedures (including critical care time)  Medications Ordered in ED Medications  diltiazem (CARDIZEM) 100 mg in dextrose 5 % 100 mL (1 mg/mL) infusion (not administered)  sodium chloride 0.9 % bolus 1,000 mL (not administered)    And  sodium chloride 0.9 % bolus 1,000 mL (not administered)    And  sodium chloride 0.9 % bolus 1,000 mL (not administered)  levofloxacin (LEVAQUIN) IVPB 750 mg (not administered)  vancomycin (VANCOCIN) IVPB 1000 mg/200 mL premix (not administered)  oseltamivir (TAMIFLU) capsule 75 mg (not administered)  ipratropium-albuterol (DUONEB) 0.5-2.5 (3) MG/3ML nebulizer solution 3 mL (not administered)  enoxaparin (LOVENOX) injection 1 mg/kg (not administered)  acetaminophen (TYLENOL) tablet 1,000 mg (1,000 mg Oral Given 05/24/16 1408)  diltiazem (CARDIZEM) injection 20 mg (20 mg Intravenous Given 05/24/16 1357)   methylPREDNISolone sodium succinate (SOLU-MEDROL) 125 mg/2 mL injection 125 mg (125 mg Intravenous Given 05/24/16 1429)  sodium chloride 0.9 % bolus 1,000 mL (0 mLs Intravenous Stopped 05/24/16 1540)  sodium chloride 0.9 % bolus 1,000 mL (1,000 mLs Intravenous New Bag/Given 05/24/16 1542)     Initial Impression / Assessment and Plan / ED Course  I have reviewed the triage vital signs and the nursing notes.  Pertinent labs & imaging results that were available during my care of the patient were reviewed by me and considered in my medical decision making (see chart for details).  Pt's CXR is clear of PNA, but I clinically suspect a PNA because of pt's sx.  Pt also with flu-like sx.  Flu is pending, but I gave him tamiflu.  Pt's HR improved after cardizem, but his bp dropped, so  we held the drip as HR has improved.  Pt given a dose of lovenox for the new onset a.fib.  Pt started on code sepsis due to his myriad of abnormal vital signs.  Pt given appropriate IVFs.   BP improving with IVFs.  Mild elevation in troponin likely due to strain of elevated hr for a few days.  For pt's COPD, he was given solu medrol and nebs.    Pt d/w unassigned ([facility]) who will see him.    CRITICAL CARE Performed by: Isla Pence   Total critical care time: 30 minutes  Critical care time was exclusive of separately billable procedures and treating other patients.  Critical care was necessary to treat or prevent imminent or life-threatening deterioration.  Critical care was time spent personally by me on the following activities: development of treatment plan with patient and/or surrogate as well as nursing, discussions with consultants, evaluation of patient's response to treatment, examination of patient, obtaining history from patient or surrogate, ordering and performing treatments and interventions, ordering and review of laboratory studies, ordering and review of radiographic studies, pulse oximetry and  re-evaluation of patient's condition.     Final Clinical Impressions(s) / ED Diagnoses   Final diagnoses:  COPD exacerbation ([place])  Atrial fibrillation with RVR ([place])  Tobacco abuse  Hypoxia  Fever, unspecified fever cause  Sepsis, due to unspecified organism [facility])    New Prescriptions New Prescriptions   No medications on file     Isla Pence, MD 05/25/16 [phone]

## 2016-05-24 NOTE — Consult Note (Signed)
Cardiology Consult Note  Admit date: 05/24/2016 Name: Jeremiah Carson 66 y.o.  male DOB:  1950-11-22 MRN:  [id number]  Today's date:  05/24/2016  Referring Physician:    Redge Gainer [facility]  Reason for Consultation:  Rapid atrial fibrillation/flutter in the setting of upper respiratory illness   IMPRESSIONS: 1.  Rapid atrial flutter or coarse atrial fibrillation in the setting of an upper respiratory illness/flu like illness 2.  Prior tobacco abuse.  He stopped smoking 3.  Current upper respiratory infection  RECOMMENDATION: He became really hypotensive on intravenous diltiazem.  He has been treated with IV fluids.   1.  I would use amiodarone in the short-term to try to convert him back to sinus rhythm as this likely will not cause hypotension.   2.  Short-term heparinization but would stop if he develops any bleeding  3.  Check TSH as well as echocardiogram.   4.  Initiate beta blockers orally at low dose  Cardiology will follow along with you.  HISTORY: This 66 year old male has previously been in reasonable health without significant illness.  He has smoked heavily for years and has greater than a 50-pack-year history of smoking.  He has been sick for about 3 weeks with cough with some production of greenish sputum.  He developed diarrhea some fever and felt like he had a flulike illness.  He quit smoking about a week ago but has continued to have symptoms and presented to the emergency room.  He was found to have atrial flutter/fibrillation with rapid response.  He was given intravenous diltiazem but hypotension and this was stopped and he was given intravenous fluids.  Cardiology was asked to assess him.  He has not had any chest pain suggestive of angina.  He does not really use alcohol to excess.  He has had significant hematuria urinary frequency as well as diarrhea and may have had some blood in his stool.  He denies PND or orthopnea.    Past Medical History:   Diagnosis Date  . Cervical disc disease   . Rocky Mountain spotted fever       Past Surgical History:  Procedure Laterality Date  . HERNIA REPAIR Bilateral   . TRACHEOSTOMY       Allergies:  is allergic to penicillins.   Medications: Prior to Admission medications   Medication Sig Start Date End Date Taking? Authorizing Provider  Pseudoephedrine-DM-GG-APAP (ROBITUSSIN COLD/COUGH/FLU PO) Take 5-10 mLs by mouth every 8 (eight) hours as needed (for flu-like or cold symptoms).   Yes Historical Provider, MD    Family History: Family Status  Relation Status  . Mother Deceased  . Father Deceased  . Sister Deceased  . Brother Deceased    Social History:   reports that he has been smoking.  He has a 67.50 pack-year smoking history. He has never used smokeless tobacco. He reports that he does not drink alcohol or use drugs.   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  Review of Systems: He has mild headache.  He has had cough productive of green sputum.  Significant diarrhea but no vomiting.  Complains of urinary frequency and may have had some blood in his urine.  Significant upper respiratory congestion.  Complains some hip pain a few weeks ago.  Other than as noted above the remainder of the review of systems is unremarkable.    Physical Exam: BP 108/72   Pulse 115 Temp 98.5  F (36.9 C) (Oral)   Resp (!) 31   Ht 6\' 4"  (1.93 m)   Wt 86.5 kg (190 lb 11.2 oz)   SpO2 92%   BMI 23.21 kg/m    General appearance: Polite, pleasant heavily bearded white male lying in bed in no acute distress Head: Normocephalic, without obvious abnormality, atraumatic Eyes: conjunctivae/corneas clear. PERRL, EOM's intact. Fundi not examined  Ears: Ears and mouth are unremarkable Neck: no adenopathy, no carotid bruit, no JVD and supple, symmetrical, trachea midline Lungs: Bilateral wheezes  Heart: Rapid irregular heart rate, normal S1 and S2, no S3 Abdomen:  soft, non-tender; bowel sounds normal; no masses,  no organomegaly Rectal: deferred Extremities: extremities normal, atraumatic, no cyanosis or edema Pulses: 2+ and symmetric Skin: Skin color, texture, turgor normal. No rashes or lesions Neurologic: Grossly normal Psych: Alert and oriented x 3 Labs: CBC  Recent Labs  05/24/16 1429  WBC 17.0*  RBC 4.24  HGB 13.1  HCT 41.6  PLT 342  MCV 98.1  MCH 30.9  MCHC 31.5  RDW 13.0  LYMPHSABS 1.4  MONOABS 1.7*  EOSABS 0.0  BASOSABS 0.0   CMP   Recent Labs  05/24/16 1429  NA 135  K 4.1  CL 91*  CO2 31  GLUCOSE 127*  BUN 16  CREATININE 0.79  CALCIUM 9.2  PROT 6.9  ALBUMIN 3.1*  AST 27  ALT 19  ALKPHOS 62  BILITOT 0.6  GFRNONAA >60  GFRAA >60   BNP (last 3 results) BNP    Component Value Date/Time   BNP 268.3 (H) 05/24/2016 1430   Cardiac Panel (last 3 results)  Recent Labs  05/24/16 1429  TROPONINI 0.05*     Radiology:  No acute abnormality, independently reviewed by me  EKG: Atrial flutter or coarse atrial fibrillation with rapid response initially somewhat slower on subsequent EKG Independently reviewed by me  Signed:  W. Ashley RoyaltySpencer Tilley, Jr. MD [facility]   Cardiology Consultant  05/24/2016, 6:53 PM

## 2016-05-24 NOTE — Progress Notes (Addendum)
ANTICOAGULATION CONSULT NOTE - Initial Consult  Pharmacy Consult for Heparin  Indication: atrial fibrillation  Allergies  Allergen Reactions  . Penicillins Anaphylaxis, Hives and Rash    Has patient had a PCN reaction causing immediate rash, facial/tongue/throat swelling, SOB or lightheadedness with hypotension: No Has patient had a PCN reaction causing severe rash involving mucus membranes or skin necrosis: Unk Has patient had a PCN reaction that required hospitalization: Unk Has patient had a PCN reaction occurring within the last 10 years: No If all of the above answers are "NO", then may proceed with Cephalosporin use.     Patient Measurements: Height: 6\' 4"  (193 cm) Weight: 190 lb 11.2 oz (86.5 kg) IBW/kg (Calculated) : 86.8 Heparin Dosing Weight: 86.5kg = TBW  Vital Signs: Temp: 98.5 F (36.9 C) (02/04 1800) Temp Source: Oral (02/04 1800) BP: 108/72 (02/04 1800) Pulse Rate: 59 (02/04 1800)  Labs:  Recent Labs  05/24/16 1429 05/24/16 1808  HGB 13.1 12.3*  HCT 41.6 38.6*  PLT 342 344  CREATININE 0.79  --   TROPONINI 0.05*  --     Estimated Creatinine Clearance: 115.6 mL/min (by C-G formula based on SCr of 0.79 mg/dL).   Medical History: Past Medical History:  Diagnosis Date  . Cervical disc disease   . Rocky Mountain spotted fever     Medications:  Prescriptions Prior to Admission  Medication Sig Dispense Refill Last Dose  . Pseudoephedrine-DM-GG-APAP (ROBITUSSIN COLD/COUGH/FLU PO) Take 5-10 mLs by mouth every 8 (eight) hours as needed (for flu-like or cold symptoms).   05/22/2016 at pm   Scheduled:  . amiodarone  150 mg Intravenous Once  . [START ON 05/25/2016] enoxaparin (LOVENOX) injection  40 mg Subcutaneous Q24H  . levofloxacin (LEVAQUIN) IV  750 mg Intravenous Once  . metoprolol tartrate  25 mg Oral BID  . vancomycin  1,000 mg Intravenous Once    Assessment: [age] y.o male presenting on 05/24/16 with complaint of flulike symptoms, found to be very  tachycardic with afib/RVR. Pharmacy consulted to start IV heparin.  Not on anticoagulant PTA.  CBC wnl. Hgb 13.1>12.3 Reported hematuria . Blood in stool - endorsed by patient. Trend hg Lovenox 80mg  dose given tonight @ 18:13 (= 1mg /kg ), thus no heparin bolus needed.    Goal of Therapy:  Heparin level 0.3-0.7 units/ml Monitor platelets by anticoagulation protocol: Yes   Plan:  Discontinue Lovenox  At 0400 AM start IV Heparin 1200 units/hr Check 6 hour heparin level Daily HL, CBC  Noah Delaineuth Yoni Lobos, RPh Clinical Pharmacist Pager: [phone] 05/24/2016,7:17 PM

## 2016-05-24 NOTE — H&P (Signed)
Family Medicine Teaching [facility] Admission History and Physical Service Pager: [phone]  Patient name: Jeremiah Carson Medical record number: [id number] Date of birth: [birth year] Age: 66 y.o. Gender: male  Primary Care Provider: No primary care provider on file. Consultants: Cardiology Code Status: Full  Chief Complaint: Flu-like illness, dyspnea  Assessment and Plan: Jeremiah Carson is a 66 y.o. male presenting with flu-like illness and dyspnea. PMH is significant for h/o RMSF and tobacco use(recently quit).  #Sepsis. Unknown etiology at this time. qSOFA score of 2 putting patient at high risk. Patient meets sepsis criteria. Leukocytosis to 17 with left shift. LA initially normal at 1.51. Flu panel negative. Endorsed some diarrhea on admission could be a GI source. Patient with normal CXR. UA pending.  -Admit to FPTS under Dr. Pollie Meyer -f/u blood cultures -UA pending -collect RVP -trend lactic acid -continue antibiotics and tamiflu  #Dyspnea. No known lung disease. Now with new oxygen requirement. ABG 7.4/50/71/33. Etiology appears to be multifactorial. Differential includes COPD exacerbation. Patient with extensive history of smoking. Recently quit. No wheezes noted on exam has increased sputum production. Also on the differential and most likely includes CHF exacerbation. Symptoms consistent with diagnosis includes fatigue, orthopnea, PND, cough, reduced exercise tolerance, and tachycardia. Denies any edema. Patient with a no prior diagnosis. BNP on admission 268.3. Differential also includes possible CAP vs viral illness. Flu negative. Chest x-ray unremarkable. -echo ordered -repeat labs in AM -daily weights -monitor I/O -SLIV in setting of fluid overload -Albuterol nebulization when necessary -steroid burst ordered to start tomorrow; s/p IV solumedrol in ED -continue antibiotics. Received one dose of Levaquin and Vanc in ED. -continue Tamiflu  #A.fib with RVR.  New-onset. CHADsVASC score of 0. Was given dose of IV diltiazem in ED and became hypotensive.  -start anticoagulation -monitor for bleeding in patient with remote history of bleeds -collect FOBT -cardiology consulted, appreciate recs -telemtry -check Mg, Phos  #Elevated Troponin. Initial troponin 0.05. EKG only showing A.fib. Patient denies any chest pain. No cardiac history. Risk factors for ACS included tobacco use. -trend troponins -cardiology consulted as above  FEN/GI: HH diet Prophylaxis: heparin for A.fib  Disposition: Admit to inpatient   History of Present Illness:  Jeremiah Carson is a 66 y.o. male presenting with dyspnea and flu-like illness. Past medical history significant for tobacco use. Patient states that he's been feeling unwell for the last 2 weeks. Feels like he had the flu. He has been having trouble breathing as well. Feels like his breathing has been labored over the last week. He endorses fatigue, weakness, and decreased exercise tolerance. Denies chest pain or edema. States that he has been coughing up dark green sputum. States that his dyspnea has been worsening especially at night. He is unable to get any sleep because he feels like he cannot get enough air into his lungs. Endorsing some palpitations. Wife states that he's been having fevers ranging from 100-101F. Endorsing decreased by mouth intake and diarrhea. States that he has had some blood in his stool along with his urine a couple times. No recent blood seen in stool or urine.  Of note patient has significant history of tobacco use. He has been smoking since age of 61. States that prior to quitting couple days ago he was smoking a couple packs a day.  In ED patient was initially admitted under code STEMI. However cardiology was consulted and patient not having STEMI. Found to have new-onset A. fib with RVR and STEMI code canceled. Patient  also code sepsis. Was given fluids and 1 dose of Levaquin, vancomycin  and Tamiflu. Lactic acid normal. Chest x-ray unremarkable. ED provider treated patient for COPD exacerbation and 1 dose of Solu-Medrol given along with DuoNeb treatment. Patient has new oxygen requirement of 3 L.  Review Of Systems: Per HPI.   ROS  Patient Active Problem List   Diagnosis Date Noted  . Sepsis (HCC) 05/24/2016  . Rash 09/17/2011    Past Medical History: Past Medical History:  Diagnosis Date  . Cervical disc disease   . [facility]   . Tobacco use     Past Surgical History: Past Surgical History:  Procedure Laterality Date  . HERNIA REPAIR Bilateral   . ORCHIECTOMY Left   . TRACHEOSTOMY    . VASECTOMY      Social History: Social History  Substance Use Topics  . Smoking status: Current Every Day Smoker    Packs/day: 1.00    Years: 35.00  . Smokeless tobacco: Never Used  . Alcohol use No   Additional social history: Married Please also refer to relevant sections of EMR.  Family History: Family History  Problem Relation Age of Onset  . Cancer Mother     lung  . Unexplained death Father   . Suicidality Sister   . Unexplained death Brother     Allergies and Medications: Allergies  Allergen Reactions  . Penicillins Anaphylaxis, Hives and Rash    Has patient had a PCN reaction causing immediate rash, facial/tongue/throat swelling, SOB or lightheadedness with hypotension: No Has patient had a PCN reaction causing severe rash involving mucus membranes or skin necrosis: Unk Has patient had a PCN reaction that required hospitalization: Unk Has patient had a PCN reaction occurring within the last 10 years: No If all of the above answers are "NO", then may proceed with Cephalosporin use.    No current facility-administered medications on file prior to encounter.    No current outpatient prescriptions on file prior to encounter.    Objective: BP 100/63   Pulse (!) 121   Temp 100.2 F (37.9 C) (Oral)   Resp (!) 29   SpO2 94%   Exam: General: alert, well-developed, NAD, cooperative HEENT: NCAT, PERRLA, no injection and anicteric. EOMI. MMM, oral mucosa and oropharynx reveal no lesions or exudates.  Neck: supple, full ROM, no thyromegaly. Old scar from trach.  Lungs: tachypnea, increased work of breathing with belly breathing, no crackles, and scattered wheezes. No rhonchi or rales. Heart: Tachycardia, irregular rhythm Abdomen: Bowel sounds normal; abdomen soft and nontender. No masses, organomegaly or hernias noted. no guarding, no rebound tenderness. Non-distened Pulses: DP/PT are full and equal bilaterally.  Extremities: No cyanosis, clubbing, edema Neurologic: No focal deficits, CN grossly intact,+5 strength globally, A&Ox3.  Skin: Intact without suspicious lesions or rashes. Cool feet.  Psych: Mood and affect are normal; no evidence of anxiety or depression.  Labs and Imaging: Results for orders placed or performed during the hospital encounter of 05/24/16 (from the past 24 hour(s))  I-Stat arterial blood gas, ED  ([facility], [facility])     Status: Abnormal   Collection Time: 05/24/16  2:19 PM  Result Value Ref Range   pH, Arterial 7.430 7.350 - 7.450   pCO2 arterial 50.3 (H) 32.0 - 48.0 mmHg   pO2, Arterial 71.0 (L) 83.0 - 108.0 mmHg   Bicarbonate 33.4 (H) 20.0 - 28.0 mmol/L   TCO2 35 0 - 100 mmol/L   O2 Saturation 94.0 %   Acid-Base  Excess 8.0 (H) 0.0 - 2.0 mmol/L   Patient temperature 99.0 F    Collection site RADIAL, ALLEN'S TEST ACCEPTABLE    Drawn by Operator    Sample type ARTERIAL   Comprehensive metabolic panel     Status: Abnormal   Collection Time: 05/24/16  2:29 PM  Result Value Ref Range   Sodium 135 135 - 145 mmol/L   Potassium 4.1 3.5 - 5.1 mmol/L   Chloride 91 (L) 101 - 111 mmol/L   CO2 31 22 - 32 mmol/L   Glucose, Bld 127 (H) 65 - 99 mg/dL   BUN 16 6 - 20 mg/dL   Creatinine, Ser 1.61 0.61 - 1.24 mg/dL   Calcium 9.2 8.9 - 09.6 mg/dL   Total Protein 6.9 6.5 - 8.1 g/dL   Albumin 3.1 (L) 3.5 -  5.0 g/dL   AST 27 15 - 41 U/L   ALT 19 17 - 63 U/L   Alkaline Phosphatase 62 38 - 126 U/L   Total Bilirubin 0.6 0.3 - 1.2 mg/dL   GFR calc non Af Amer >60 >60 mL/min   GFR calc Af Amer >60 >60 mL/min   Anion gap 13 5 - 15  CBC WITH DIFFERENTIAL     Status: Abnormal   Collection Time: 05/24/16  2:29 PM  Result Value Ref Range   WBC 17.0 (H) 4.0 - 10.5 K/uL   RBC 4.24 4.22 - 5.81 MIL/uL   Hemoglobin 13.1 13.0 - 17.0 g/dL   HCT 04.5 40.9 - 81.1 %   MCV 98.1 78.0 - 100.0 fL   MCH 30.9 26.0 - 34.0 pg   MCHC 31.5 30.0 - 36.0 g/dL   RDW 91.4 78.2 - 95.6 %   Platelets 342 150 - 400 K/uL   Neutrophils Relative % 82 %   Lymphocytes Relative 8 %   Monocytes Relative 10 %   Eosinophils Relative 0 %   Basophils Relative 0 %   Neutro Abs 13.9 (H) 1.7 - 7.7 K/uL   Lymphs Abs 1.4 0.7 - 4.0 K/uL   Monocytes Absolute 1.7 (H) 0.1 - 1.0 K/uL   Eosinophils Absolute 0.0 0.0 - 0.7 K/uL   Basophils Absolute 0.0 0.0 - 0.1 K/uL   Smear Review MORPHOLOGY UNREMARKABLE   Troponin I     Status: Abnormal   Collection Time: 05/24/16  2:29 PM  Result Value Ref Range   Troponin I 0.05 (HH) <0.03 ng/mL  Brain natriuretic peptide     Status: Abnormal   Collection Time: 05/24/16  2:30 PM  Result Value Ref Range   B Natriuretic Peptide 268.3 (H) 0.0 - 100.0 pg/mL  I-Stat CG4 Lactic Acid, ED     Status: None   Collection Time: 05/24/16  2:34 PM  Result Value Ref Range   Lactic Acid, Venous 1.52 0.5 - 1.9 mmol/L  Influenza panel by PCR (type A & B)     Status: None   Collection Time: 05/24/16  2:40 PM  Result Value Ref Range   Influenza A By PCR NEGATIVE NEGATIVE   Influenza B By PCR NEGATIVE NEGATIVE     Pincus Large, DO 05/24/2016, 3:50 PM PGY-3, [facility] FPTS Intern pager: [phone], text pages welcome

## 2016-05-24 NOTE — ED Triage Notes (Addendum)
Patient presents today with complaints of SOB. Patient states cough for 2 weeks. Patient  Was 82% of RA. Patient placed on nonRB. Patient on  monitor with EMS SVT 180. Patient denies any pain. Patient alert and oriented x4 on arrival. Patient rate on arrival 157. Patient  o2 98 on 2L. EMS gave 324 ASA.

## 2016-05-25 ENCOUNTER — Inpatient Hospital Stay (HOSPITAL_COMMUNITY): Payer: 59

## 2016-05-25 ENCOUNTER — Encounter (HOSPITAL_COMMUNITY): Payer: Self-pay | Admitting: Cardiology

## 2016-05-25 DIAGNOSIS — J441 Chronic obstructive pulmonary disease with (acute) exacerbation: Secondary | ICD-10-CM

## 2016-05-25 DIAGNOSIS — Z720 Tobacco use: Secondary | ICD-10-CM

## 2016-05-25 DIAGNOSIS — I4891 Unspecified atrial fibrillation: Secondary | ICD-10-CM

## 2016-05-25 DIAGNOSIS — R0600 Dyspnea, unspecified: Secondary | ICD-10-CM

## 2016-05-25 DIAGNOSIS — A770 Spotted fever due to Rickettsia rickettsii: Secondary | ICD-10-CM | POA: Diagnosis present

## 2016-05-25 DIAGNOSIS — R0602 Shortness of breath: Secondary | ICD-10-CM

## 2016-05-25 LAB — HEPARIN LEVEL (UNFRACTIONATED)
Heparin Unfractionated: 0.15 IU/mL — ABNORMAL LOW (ref 0.30–0.70)
Heparin Unfractionated: 0.17 IU/mL — ABNORMAL LOW (ref 0.30–0.70)

## 2016-05-25 LAB — ECHOCARDIOGRAM COMPLETE
HEIGHTINCHES: 76 in
WEIGHTICAEL: 3065.28 [oz_av]

## 2016-05-25 LAB — RESPIRATORY PANEL BY PCR
ADENOVIRUS-RVPPCR: NOT DETECTED
Bordetella pertussis: NOT DETECTED
CHLAMYDOPHILA PNEUMONIAE-RVPPCR: NOT DETECTED
CORONAVIRUS 229E-RVPPCR: NOT DETECTED
CORONAVIRUS NL63-RVPPCR: NOT DETECTED
Coronavirus HKU1: NOT DETECTED
Coronavirus OC43: NOT DETECTED
INFLUENZA A-RVPPCR: NOT DETECTED
Influenza B: NOT DETECTED
MYCOPLASMA PNEUMONIAE-RVPPCR: NOT DETECTED
Metapneumovirus: NOT DETECTED
Parainfluenza Virus 1: NOT DETECTED
Parainfluenza Virus 2: NOT DETECTED
Parainfluenza Virus 3: NOT DETECTED
Parainfluenza Virus 4: NOT DETECTED
Respiratory Syncytial Virus: NOT DETECTED
Rhinovirus / Enterovirus: NOT DETECTED

## 2016-05-25 LAB — BASIC METABOLIC PANEL
Anion gap: 6 (ref 5–15)
BUN: 15 mg/dL (ref 6–20)
CHLORIDE: 96 mmol/L — AB (ref 101–111)
CO2: 31 mmol/L (ref 22–32)
CREATININE: 0.68 mg/dL (ref 0.61–1.24)
Calcium: 8.7 mg/dL — ABNORMAL LOW (ref 8.9–10.3)
GFR calc non Af Amer: >60 mL/min (ref >60)
Glucose, Bld: 144 mg/dL — ABNORMAL HIGH (ref 65–99)
POTASSIUM: 4.5 mmol/L (ref 3.5–5.1)
SODIUM: 133 mmol/L — AB (ref 135–145)

## 2016-05-25 LAB — CBC
HEMATOCRIT: 38.4 % — AB (ref 39.0–52.0)
Hemoglobin: 12.4 g/dL — ABNORMAL LOW (ref 13.0–17.0)
MCH: 31.6 pg (ref 26.0–34.0)
MCHC: 32.3 g/dL (ref 30.0–36.0)
MCV: 98 fL (ref 78.0–100.0)
Platelets: 340 10*3/uL (ref 150–400)
RBC: 3.92 MIL/uL — AB (ref 4.22–5.81)
RDW: 13 % (ref 11.5–15.5)
WBC: 17.7 10*3/uL — AB (ref 4.0–10.5)

## 2016-05-25 LAB — TROPONIN I
Troponin I: 0.08 ng/mL (ref <0.03)
Troponin I: 0.03 ng/mL (ref <0.03)

## 2016-05-25 LAB — LACTIC ACID, PLASMA
LACTIC ACID, VENOUS: 1.2 mmol/L (ref 0.5–1.9)
Lactic Acid, Venous: 2.1 mmol/L (ref 0.5–1.9)
Lactic Acid, Venous: 1.2 mmol/L (ref 0.5–1.9)

## 2016-05-25 LAB — OCCULT BLOOD X 1 CARD TO LAB, STOOL: FECAL OCCULT BLD: NEGATIVE

## 2016-05-25 MED ORDER — MAGNESIUM SULFATE 2 GM/50ML IV SOLN
2.0000 g | Freq: Once | INTRAVENOUS | Status: AC
  Administered 2016-05-25: 2 g via INTRAVENOUS
  Filled 2016-05-25: qty 50

## 2016-05-25 MED ORDER — UMECLIDINIUM BROMIDE 62.5 MCG/INH IN AEPB
1.0000 | INHALATION_SPRAY | Freq: Every day | RESPIRATORY_TRACT | Status: DC
  Administered 2016-05-26 – 2016-05-28 (×3): 1 via RESPIRATORY_TRACT
  Filled 2016-05-25: qty 7

## 2016-05-25 MED ORDER — HEPARIN (PORCINE) IN NACL 100-0.45 UNIT/ML-% IJ SOLN
1850.0000 [IU]/h | INTRAMUSCULAR | Status: DC
  Administered 2016-05-25: 1500 [IU]/h via INTRAVENOUS
  Administered 2016-05-26: 1950 [IU]/h via INTRAVENOUS
  Administered 2016-05-27: 2000 [IU]/h via INTRAVENOUS
  Filled 2016-05-25 (×6): qty 250

## 2016-05-25 NOTE — Progress Notes (Signed)
ANTICOAGULATION CONSULT NOTE   Pharmacy Consult for Heparin  Indication: atrial fibrillation  Allergies  Allergen Reactions  . Penicillins Anaphylaxis, Hives and Rash    Has patient had a PCN reaction causing immediate rash, facial/tongue/throat swelling, SOB or lightheadedness with hypotension: No Has patient had a PCN reaction causing severe rash involving mucus membranes or skin necrosis: Unk Has patient had a PCN reaction that required hospitalization: Unk Has patient had a PCN reaction occurring within the last 10 years: No If all of the above answers are "NO", then may proceed with Cephalosporin use.     Patient Measurements: Height: 6\' 4"  (193 cm) Weight: 191 lb 9.3 oz (86.9 kg) IBW/kg (Calculated) : 86.8 Heparin Dosing Weight: 86.5kg = TBW  Vital Signs: Temp: 98.4 F (36.9 C) (02/05 1157) Temp Source: Oral (02/05 1157) BP: 98/76 (02/05 1157) Pulse Rate: 81 (02/05 1200)  Labs:  Recent Labs  05/24/16 1429 05/24/16 1808 05/24/16 2326 05/25/16 0644 05/25/16 1203  HGB 13.1 12.3*  --  12.4*  --   HCT 41.6 38.6*  --  38.4*  --   PLT 342 344  --  340  --   HEPARINUNFRC  --   --   --   --  0.15*  CREATININE 0.79 0.78  --  0.68  --   TROPONINI 0.05* 0.07* 0.08* 0.03*  --     Estimated Creatinine Clearance: 116 mL/min (by C-G formula based on SCr of 0.68 mg/dL).   Medical History: Past Medical History:  Diagnosis Date  . Cervical disc disease   . [place] spotted fever age 89  . Tobacco use     Medications:  Prescriptions Prior to Admission  Medication Sig Dispense Refill Last Dose  . Pseudoephedrine-DM-GG-APAP (ROBITUSSIN COLD/COUGH/FLU PO) Take 5-10 mLs by mouth every 8 (eight) hours as needed (for flu-like or cold symptoms).   05/22/2016 at pm   Scheduled:  . levofloxacin (LEVAQUIN) IV  750 mg Intravenous Q24H  . metoprolol tartrate  25 mg Oral BID  . predniSONE  50 mg Oral Q breakfast  . vancomycin  1,000 mg Intravenous Q8H    Assessment: 66  y.o male presenting on 05/24/16 with complaint of flulike symptoms, found to be very tachycardic with afib/RVR.  Initial heparin level is SUBtherapeutic at 0.15 on 1200 unit/hr. No reported bleeding and heme/occult negative, although pt reports blood in stool prior to admission.    Goal of Therapy:  Heparin level 0.3-0.7 units/ml Monitor platelets by anticoagulation protocol: Yes   Plan:  Increase heparin gtt to 1500 units/hr Heparin level in 6 hours  Daily heparin level and CBC Monitor for sxs of bleeding   Pollyann SamplesAndy Elnore Cosens, PharmD, BCPS 05/25/2016, 1:14 PM

## 2016-05-25 NOTE — Progress Notes (Signed)
Family Medicine Teaching Service Daily Progress Note Intern Pager: [phone]  Patient name: Jeremiah Carson Medical record number: [id number] Date of birth: [birth year] Age: 66 y.o. Gender: male  Primary Care Provider: Pcp Not In System Consultants: cardiology Code Status: full  Pt Overview and Major Events to Date:  05/24/16- admitted to FPTS  Assessment and Plan: Jeremiah Carson Reffitt is a 66 y.o. male presenting with flu-like illness and dyspnea. PMH is significant for h/o RMSF and tobacco use(recently quit).  #Sepsis. Unknown etiology at this time. qSOFA score of 2 putting patient at high risk. Patient meets sepsis criteria. Leukocytosis to 17 with left shift. LA initially normal at 1.51. Flu panel negative. Endorsed some diarrhea on admission could be a GI source. Patient with normal CXR. Patient on Levaquin and Vancomycin -blood cx pending -UA normal -RVP pending -lactic acid 1.5>2.1>1.2 -continue antibiotics and tamiflu  #Dyspnea. No known lung disease. Now with new oxygen requirement. ABG 7.4/50/71/33. Etiology appears to be multifactorial. Differential includes COPD exacerbation. Patient with extensive history of smoking. Recently quit 1 week ago cold [place]. No wheezes noted on exam has increased sputum production. Also on the differential and most likely includes CHF exacerbation. Symptoms consistent with diagnosis includes fatigue, orthopnea, PND, cough, reduced exercise tolerance, and tachycardia. Denies any edema. Patient with a no prior diagnosis. BNP on admission 268.3. Differential also includes possible CAP vs viral illness. Flu negative. Chest x-ray unremarkable. -echo pending -daily weights -monitor I/O -SLIV in setting of fluid overload -Albuterol nebulization when necessary -s/p solumedrol in ED. Start prednisone burst today day 1/5 -continue antibiotics -continue Tamiflu  #A.fib with RVR. New-onset. CHADsVASC score of 0. HR 90's this morning, in afib  -continue  to monitor on telemetry -cardiology following, appreciate recommendations -continue heparin gtt, monitor for bleeding in patient with remote history of bleeds -FOBT collected, pending -Magnesium low at 1.7, will replete -phosphorus normal  #Elevated Troponin. Initial troponin 0.05. EKG only showing A.fib. Patient denies any chest pain. No cardiac history. Risk factors for ACS included tobacco use. -trend troponins: 0.07>0.08>0.03 -cardiology consulted as above  FEN/GI: HH diet Prophylaxis: heparin gtt  Disposition: pending clinical improvement  Subjective:  Jeremiah Carson is doing well this morning he feels a lot improved from yesterday. He is upset about the prospect of taking medications after leaving the hospital but "will do what you all think is best". He reports trouble breathing for past several years that has worsened in past few weeks. He feels low energy. Had 1 episode or BRB per rectum last week while he was having diarrhea. Has had hemorrhoids in past.   Objective: Temp:  [97.8 F (36.6 C)-100.2 F (37.9 C)] 97.8 F (36.6 C) (02/05 0255) Pulse Rate:  [59-158] 93 (02/05 0255) Resp:  [18-36] 25 (02/05 0255) BP: (85-126)/(63-91) 115/83 (02/05 0255) SpO2:  [90 %-98 %] 90 % (02/05 0255) FiO2 (%):  [28 %] 28 % (02/04 1356) Weight:  [186 lb (84.4 kg)-191 lb 9.3 oz (86.9 kg)] 191 lb 9.3 oz (86.9 kg) (02/05 0259) Physical Exam: General: elderly man sitting up in bed, Johnson in place, in NAD Cardiovascular: irregular rhythm, slightly tachycardic, no MRG Respiratory: no increased work of breathing. Poor air movement bilaterally. No adventitious lung sounds Abdomen: soft, non-tender, non-distended, +BS Rectal: non-tender, good tone, no blood noted, no masses palpated Extremities: no edema or cyanosis  Laboratory:  Recent Labs Lab 05/24/16 1429 05/24/16 1808 05/25/16 0644  WBC 17.0* 18.4* 17.7*  HGB 13.1 12.3* 12.4*  HCT 41.6 38.6* 38.4*  PLT 342 344 340    Recent  Labs Lab 05/24/16 1429 05/24/16 1808  NA 135  --   K 4.1  --   CL 91*  --   CO2 31  --   BUN 16  --   CREATININE 0.79 0.78  CALCIUM 9.2  --   PROT 6.9  --   BILITOT 0.6  --   ALKPHOS 62  --   ALT 19  --   AST 27  --   GLUCOSE 127*  --     Lactic acid 1.5>2.1>1.2  Imaging/Diagnostic Tests: Dg Chest Port 1 View  Result Date: 05/24/2016 CLINICAL DATA:  66 year old male with shortness of breath and cough for 2 weeks. Delirium yesterday. Initial encounter. EXAM: PORTABLE CHEST 1 VIEW COMPARISON:  None FINDINGS: Portable AP upright view at 1408 hours. Pulmonary hyperinflation suspected. Allowing for portable technique the lungs are clear. No pneumothorax or pleural effusion. Normal cardiac size and mediastinal contours. Visualized tracheal air column is within normal limits. Negative visible bowel gas pattern. IMPRESSION: No acute cardiopulmonary abnormality. Pulmonary hyperinflation suspected. Electronically Signed   By: Odessa Fleming M.D.   On: 05/24/2016 14:20     Tillman Sers, DO 05/25/2016, 7:26 AM PGY-1, [facility] FPTS Intern pager: [phone], text pages welcome

## 2016-05-25 NOTE — Progress Notes (Signed)
Progress Note  Patient Name: Jeremiah Carson Date of Encounter: 05/25/2016  Primary Cardiologist: New- Dr. Elease HashimotoNahser  Subjective   Pt feeling a little better today. Has mild chest tightness, worse with coughing.   Inpatient Medications    Scheduled Meds: . levofloxacin (LEVAQUIN) IV  750 mg Intravenous Q24H  . metoprolol tartrate  25 mg Oral BID  . predniSONE  50 mg Oral Q breakfast  . vancomycin  1,000 mg Intravenous Q8H   Continuous Infusions: . amiodarone 30 mg/hr (05/25/16 0837)  . heparin 14 Units/kg/hr (05/25/16 0700)   PRN Meds: albuterol   Vital Signs    Vitals:   05/25/16 0255 05/25/16 0259 05/25/16 0700 05/25/16 0754  BP: 115/83   109/82  Pulse: 93  83 84  Resp: (!) 25  (!) 27 (!) 27  Temp: 97.8 F (36.6 C)   97.8 F (36.6 C)  TempSrc: Oral   Axillary  SpO2: 90%  (!) 86% (!) 88%  Weight:  191 lb 9.3 oz (86.9 kg)    Height:        Intake/Output Summary (Last 24 hours) at 05/25/16 0949 Last data filed at 05/25/16 0757  Gross per 24 hour  Intake          4350.97 ml  Output              700 ml  Net          3650.97 ml   Filed Weights   05/24/16 1600 05/24/16 1800 05/25/16 0259  Weight: 186 lb (84.4 kg) 190 lb 11.2 oz (86.5 kg) 191 lb 9.3 oz (86.9 kg)    Telemetry    Atrial flutter with rates in the 80's - Personally Reviewed  ECG    Atrial flutter at 145 bpm, 2:1 conduction - Personally Reviewed  Physical Exam   GEN: No acute distress. Neck: No JVD Cardiac: Mildly Irregularly irregular, no murmurs, rubs, or gallops.  Respiratory: Coarse expiratory wheezes, using supplemental oxygen GI: Soft, nontender, non-distended  MS: No edema; No deformity. Fingernail and toenail clubbing noted. Neuro:  Nonfocal  Psych: Normal affect   Labs    Chemistry Recent Labs Lab 05/24/16 1429 05/24/16 1808 05/25/16 0644  NA 135  --  133*  K 4.1  --  4.5  CL 91*  --  96*  CO2 31  --  31  GLUCOSE 127*  --  144*  BUN 16  --  15  CREATININE 0.79 0.78  0.68  CALCIUM 9.2  --  8.7*  PROT 6.9  --   --   ALBUMIN 3.1*  --   --   AST 27  --   --   ALT 19  --   --   ALKPHOS 62  --   --   BILITOT 0.6  --   --   GFRNONAA >60 >60 >60  GFRAA >60 >60 >60  ANIONGAP 13  --  6     Hematology Recent Labs Lab 05/24/16 1429 05/24/16 1808 05/25/16 0644  WBC 17.0* 18.4* 17.7*  RBC 4.24 3.95* 3.92*  HGB 13.1 12.3* 12.4*  HCT 41.6 38.6* 38.4*  MCV 98.1 97.7 98.0  MCH 30.9 31.1 31.6  MCHC 31.5 31.9 32.3  RDW 13.0 13.0 13.0  PLT 342 344 340    Cardiac Enzymes Recent Labs Lab 05/24/16 1429 05/24/16 1808 05/24/16 2326 05/25/16 0644  TROPONINI 0.05* 0.07* 0.08* 0.03*   No results for input(s): TROPIPOC in the last 168 hours.   BNP  Recent Labs Lab 05/24/16 1430  BNP 268.3*     DDimer No results for input(s): DDIMER in the last 168 hours.   Radiology    Dg Chest Port 1 View  Result Date: 05/24/2016 CLINICAL DATA:  66 year old male with shortness of breath and cough for 2 weeks. Delirium yesterday. Initial encounter. EXAM: PORTABLE CHEST 1 VIEW COMPARISON:  None FINDINGS: Portable AP upright view at 1408 hours. Pulmonary hyperinflation suspected. Allowing for portable technique the lungs are clear. No pneumothorax or pleural effusion. Normal cardiac size and mediastinal contours. Visualized tracheal air column is within normal limits. Negative visible bowel gas pattern. IMPRESSION: No acute cardiopulmonary abnormality. Pulmonary hyperinflation suspected. Electronically Signed   By: Odessa Fleming M.D.   On: 05/24/2016 14:20    Cardiac Studies   Echo done- results pending  Patient Profile     66 y.o. male with past medical history significant for >50 Pack year smoking history, [place] Spotted fever at age 50 and cervical disc disease who presented to the [facility] ED for evaluation of flulike symptoms. He was found to be in atrial flutter with rapid RVR.   Assessment & Plan    Atrial flutter -Pt has had flulike symptoms for over 2  weeks, yesterday felt weak and lightheaded when tried to take a shower. He has had occasional heart fluttering over the last week. Has mild chest tightness worse with coughing.  -Troponins 0.05, 0.07, 0.08, 0.03. Mildly elevated, likely demand ischemia in setting of respiratory illness and atrial flutter tachycardia -BNP 268.3 -TSH 0.437 -CXR: No acute cardiopulmonary abnormality. Pulmonary hyperinflation suspected -Initially given IV cardizem, but became hypotensive. Started on IV amiodarone for the short term and beta blocker. IV fluids given. -Continues to be in atrial flutter with rates well controlled in the 80's. BP stable.  -This patient's CHA2DS2-VASc Score is 0 with the little medical history that is available. Will check echo for LV function and wall motion. Will need anticoagulation for 4 weeks if planning DCCV, but atrial flutter may self convert with resolution of current illness. Pt is on IV heparin now and we will revisit anticoagulation prior to discharge. Pt has had blood in stool and hematuria, may need to discontinue heparin if develops significant bleeding. Hgb is stable. -Continue metoprolol 25 mg bid. Will discuss amiodarone continuation with cardiologist.  Sepsis  -Flulike symptoms with sepsis - cough, sputum production, weakness, diarrhea, nausea, poor PO intake. Lactic acid peaked at 2.1, 1.2 this morning. Leukocytosis -Negative for influenza A and B. RSV panel in process. -Managed by IM  Tobacco use -Greater than 50 pack years -Reports that he quit smoking and drinking tea and coffee 1 week ago. Encourage continued tobacco cessation.  Signed, Berton Bon, NP  05/25/2016, 9:49 AM    Attending Note:   The patient was seen and examined.  Agree with assessment and plan as noted above.  Changes made to the above note as needed.  Patient seen and independently examined with Lizabeth Leyden, NP .   We discussed all aspects of the encounter. I agree with the assessment and  plan as stated above.  1. Atrial flutter - newly diagnosed.   possibily associated with his viral illness.  Echo shows normal LV function  - EF 50-55%.  Mild TR,    I have spent a total of 40 minutes with patient reviewing hospital  notes , telemetry, EKGs, labs and examining patient as well as establishing an assessment and plan that was discussed with the  patient. > 50% of time was spent in direct patient care.    Vesta Mixer, Montez Hageman., MD, [facility] 05/25/2016, 2:07 PM [address]  [address] Office [phone] Pager [phone]

## 2016-05-25 NOTE — Progress Notes (Signed)
ANTICOAGULATION CONSULT NOTE   Pharmacy Consult for Heparin  Indication: atrial fibrillation  Allergies  Allergen Reactions  . Penicillins Anaphylaxis, Hives and Rash    Has patient had a PCN reaction causing immediate rash, facial/tongue/throat swelling, SOB or lightheadedness with hypotension: No Has patient had a PCN reaction causing severe rash involving mucus membranes or skin necrosis: Unk Has patient had a PCN reaction that required hospitalization: Unk Has patient had a PCN reaction occurring within the last 10 years: No If all of the above answers are "NO", then may proceed with Cephalosporin use.     Patient Measurements: Height: 6\' 4"  (193 cm) Weight: 191 lb 9.3 oz (86.9 kg) IBW/kg (Calculated) : 86.8 Heparin Dosing Weight: 86.5kg = TBW  Vital Signs: Temp: 97.8 F (36.6 C) (02/05 1947) Temp Source: Oral (02/05 1947) BP: 107/78 (02/05 2100) Pulse Rate: 81 (02/05 2100)  Labs:  Recent Labs  05/24/16 1429 05/24/16 1808 05/24/16 2326 05/25/16 0644 05/25/16 1203 05/25/16 2054  HGB 13.1 12.3*  --  12.4*  --   --   HCT 41.6 38.6*  --  38.4*  --   --   PLT 342 344  --  340  --   --   HEPARINUNFRC  --   --   --   --  0.15* 0.17*  CREATININE 0.79 0.78  --  0.68  --   --   TROPONINI 0.05* 0.07* 0.08* 0.03*  --   --     Estimated Creatinine Clearance: 116 mL/min (by C-G formula based on SCr of 0.68 mg/dL).   Medical History: Past Medical History:  Diagnosis Date  . Cervical disc disease   . [place] spotted fever age 89  . Tobacco use     Medications:  Prescriptions Prior to Admission  Medication Sig Dispense Refill Last Dose  . Pseudoephedrine-DM-GG-APAP (ROBITUSSIN COLD/COUGH/FLU PO) Take 5-10 mLs by mouth every 8 (eight) hours as needed (for flu-like or cold symptoms).   05/22/2016 at pm   Scheduled:  . levofloxacin (LEVAQUIN) IV  750 mg Intravenous Q24H  . metoprolol tartrate  25 mg Oral BID  . predniSONE  50 mg Oral Q breakfast  . umeclidinium  bromide  1 puff Inhalation Daily  . vancomycin  1,000 mg Intravenous Q8H    Assessment: 66 y.o male presenting on 05/24/16 with complaint of flulike symptoms, found to be very tachycardic with afib/RVR.   Heparin level still below goal on. No reported bleeding. Will increase rate.  Goal of Therapy:  Heparin level 0.3-0.7 units/ml Monitor platelets by anticoagulation protocol: Yes   Plan:  Increase heparin gtt to 1700 units/hr Heparin level in 6 hours  Daily heparin level and CBC Monitor for sxs of bleeding   Sheppard CoilFrank Wilson PharmD., BCPS Clinical Pharmacist Pager [phone] 05/25/2016 9:56 PM

## 2016-05-25 NOTE — Progress Notes (Signed)
  Echocardiogram 2D Echocardiogram has been performed.  Jeremiah Carson, Jeremiah Carson 05/25/2016, 12:01 PM

## 2016-05-26 DIAGNOSIS — Z91199 Patient's noncompliance with other medical treatment and regimen due to unspecified reason: Secondary | ICD-10-CM

## 2016-05-26 DIAGNOSIS — Z720 Tobacco use: Secondary | ICD-10-CM

## 2016-05-26 DIAGNOSIS — R0902 Hypoxemia: Secondary | ICD-10-CM

## 2016-05-26 DIAGNOSIS — Z9119 Patient's noncompliance with other medical treatment and regimen: Secondary | ICD-10-CM

## 2016-05-26 LAB — CBC
HEMATOCRIT: 34 % — AB (ref 39.0–52.0)
HEMOGLOBIN: 11.1 g/dL — AB (ref 13.0–17.0)
MCH: 31.9 pg (ref 26.0–34.0)
MCHC: 32.6 g/dL (ref 30.0–36.0)
MCV: 97.7 fL (ref 78.0–100.0)
Platelets: 352 10*3/uL (ref 150–400)
RBC: 3.48 MIL/uL — AB (ref 4.22–5.81)
RDW: 12.8 % (ref 11.5–15.5)
WBC: 19.3 10*3/uL — ABNORMAL HIGH (ref 4.0–10.5)

## 2016-05-26 LAB — RENAL FUNCTION PANEL
Albumin: 2.4 g/dL — ABNORMAL LOW (ref 3.5–5.0)
Anion gap: 9 (ref 5–15)
BUN: 16 mg/dL (ref 6–20)
CO2: 32 mmol/L (ref 22–32)
Calcium: 8.6 mg/dL — ABNORMAL LOW (ref 8.9–10.3)
Chloride: 90 mmol/L — ABNORMAL LOW (ref 101–111)
Creatinine, Ser: 0.71 mg/dL (ref 0.61–1.24)
GFR calc Af Amer: >60 mL/min
GFR calc non Af Amer: >60 mL/min
Glucose, Bld: 160 mg/dL — ABNORMAL HIGH (ref 65–99)
Phosphorus: 3.4 mg/dL (ref 2.5–4.6)
Potassium: 4.2 mmol/L (ref 3.5–5.1)
Sodium: 131 mmol/L — ABNORMAL LOW (ref 135–145)

## 2016-05-26 LAB — HEPARIN LEVEL (UNFRACTIONATED)
HEPARIN UNFRACTIONATED: 0.28 [IU]/mL — AB (ref 0.30–0.70)
Heparin Unfractionated: 0.23 [IU]/mL — ABNORMAL LOW (ref 0.30–0.70)
Heparin Unfractionated: 0.76 [IU]/mL — ABNORMAL HIGH (ref 0.30–0.70)

## 2016-05-26 MED ORDER — AMIODARONE HCL 200 MG PO TABS
400.0000 mg | ORAL_TABLET | Freq: Two times a day (BID) | ORAL | Status: DC
  Administered 2016-05-26 – 2016-05-28 (×5): 400 mg via ORAL
  Filled 2016-05-26 (×4): qty 2

## 2016-05-26 MED ORDER — HEPARIN BOLUS VIA INFUSION
1000.0000 [IU] | Freq: Once | INTRAVENOUS | Status: AC
  Administered 2016-05-26: 1000 [IU] via INTRAVENOUS
  Filled 2016-05-26: qty 1000

## 2016-05-26 MED ORDER — ALBUTEROL SULFATE (2.5 MG/3ML) 0.083% IN NEBU
INHALATION_SOLUTION | RESPIRATORY_TRACT | Status: AC
  Administered 2016-05-26: 2.5 mg via RESPIRATORY_TRACT
  Filled 2016-05-26: qty 3

## 2016-05-26 MED ORDER — ALBUTEROL SULFATE (2.5 MG/3ML) 0.083% IN NEBU
2.5000 mg | INHALATION_SOLUTION | Freq: Four times a day (QID) | RESPIRATORY_TRACT | Status: DC
  Administered 2016-05-26 (×3): 2.5 mg via RESPIRATORY_TRACT
  Filled 2016-05-26 (×2): qty 3

## 2016-05-26 MED ORDER — ALBUTEROL SULFATE (2.5 MG/3ML) 0.083% IN NEBU
2.5000 mg | INHALATION_SOLUTION | Freq: Three times a day (TID) | RESPIRATORY_TRACT | Status: DC
  Administered 2016-05-27 – 2016-05-28 (×4): 2.5 mg via RESPIRATORY_TRACT
  Filled 2016-05-26 (×3): qty 3

## 2016-05-26 NOTE — Progress Notes (Signed)
ANTICOAGULATION CONSULT NOTE   Pharmacy Consult for Heparin  Indication: atrial fibrillation  Allergies  Allergen Reactions  . Penicillins Anaphylaxis, Hives and Rash    Has patient had a PCN reaction causing immediate rash, facial/tongue/throat swelling, SOB or lightheadedness with hypotension: No Has patient had a PCN reaction causing severe rash involving mucus membranes or skin necrosis: Unk Has patient had a PCN reaction that required hospitalization: Unk Has patient had a PCN reaction occurring within the last 10 years: No If all of the above answers are "NO", then may proceed with Cephalosporin use.     Patient Measurements: Height: 6\' 4"  (193 cm) Weight: 196 lb 10.4 oz (89.2 kg) IBW/kg (Calculated) : 86.8 Heparin Dosing Weight: 86.5kg = TBW  Vital Signs: Temp: 98.9 F (37.2 C) (02/06 2102) Temp Source: Oral (02/06 2102) BP: 88/55 (02/06 2102) Pulse Rate: 92 (02/06 2113)  Labs:  Recent Labs  05/24/16 1808 05/24/16 2326 05/25/16 0644  05/26/16 0330 05/26/16 1250 05/26/16 2029  HGB 12.3*  --  12.4*  --  11.1*  --   --   HCT 38.6*  --  38.4*  --  34.0*  --   --   PLT 344  --  340  --  352  --   --   HEPARINUNFRC  --   --   --   < > 0.23* 0.28* 0.76*  CREATININE 0.78  --  0.68  --  0.71  --   --   TROPONINI 0.07* 0.08* 0.03*  --   --   --   --   < > = values in this interval not displayed.  Estimated Creatinine Clearance: 116 mL/min (by C-G formula based on SCr of 0.71 mg/dL).   Medical History: Past Medical History:  Diagnosis Date  . Cervical disc disease   . [place] spotted fever age 89  . Tobacco use     Medications:  Prescriptions Prior to Admission  Medication Sig Dispense Refill Last Dose  . Pseudoephedrine-DM-GG-APAP (ROBITUSSIN COLD/COUGH/FLU PO) Take 5-10 mLs by mouth every 8 (eight) hours as needed (for flu-like or cold symptoms).   05/22/2016 at pm   Scheduled:  . albuterol  2.5 mg Nebulization Q6H  . amiodarone  400 mg Oral BID  .  levofloxacin (LEVAQUIN) IV  750 mg Intravenous Q24H  . metoprolol tartrate  25 mg Oral BID  . predniSONE  50 mg Oral Q breakfast  . umeclidinium bromide  1 puff Inhalation Daily    Assessment: 66 y.o male presenting on 05/24/16 with complaint of flulike symptoms, found to be very tachycardic with afib/RVR. Patient is now in NSR.   Heparin level now above goal on 2100 units/hr. Per RN no issues with infusion, no bleeding.   Goal of Therapy:  Heparin level 0.3-0.7 units/ml Monitor platelets by anticoagulation protocol: Yes   Plan:  Decrease heparin gtt to 2000 units/hr Daily heparin level and CBC Monitor for sxs of bleeding   Sheppard CoilFrank Tierra Divelbiss PharmD., BCPS Clinical Pharmacist Pager [phone] 05/26/2016 9:17 PM

## 2016-05-26 NOTE — Discharge Summary (Signed)
[facility] Medicine [facility] Discharge Summary  Patient name: Lorenz CoasterBilly J Antonacci Medical record number: [id number] Date of birth: 06-13-1950 Age: 66 y.o. Gender: male Date of Admission: 05/24/2016  Date of Discharge: 05/29/16  Admitting Physician: Latrelle DodrillBrittany J McIntyre, MD  Primary Care Provider: Pcp Not In System Consultants: cardiology  Indication for Hospitalization: dyspnea  Discharge Diagnoses/Problem List:  Dyspnea Atrial fibrillation with RVR Tobacco abuse COPD CAP Sepsis, resolved  Disposition: home  Discharge Condition: stable  Discharge Exam: see progress note from day of discharge  Brief Hospital Course:   Delsa BernBilly Malicoat is a 66 year old male with PMH of tobacco abuse who [facility] with dyspnea, fevers, and chills. He was found to be in atrial fibrillation with RVR. [facility] He was initially septic with fever, leukocytosis, and had evidence of pneumonia on CXR. He was requiring 2L of oxygen to keep saturations above 90%. He was started on Vancomycin and Levaquin for CAP. He received steroids for possible COPD. Cardiology was consulted and started patient on amiodarone drip for atrial fibrillation. Patient converted to normal rhythm with this medication. Heart failure was ruled out with normal echocardiogram. He was started on controller medication for presumed COPD. Patient clinically improved during his stay and was saturating well on room air. He ambulated and did not have any desaturations. He was stable for discharge home on 05/29/15.   Issues for Follow Up:  1. Potential COPD new diagnosis- recommend outpatient PFT's 2. Follow up with cardiology for atrial fibrillation, continue amiodarone for several months  Significant Procedures: none  Significant Labs and Imaging:   Recent Labs Lab 05/26/16 0330 05/27/16 0306 05/28/16 0551  WBC 19.3* 11.6* 10.5  HGB 11.1* 10.9* 11.5*  HCT 34.0* 33.7* 36.3*  PLT  352 356 378    Recent Labs Lab 05/24/16 1429 05/24/16 1808 05/25/16 0644 05/26/16 0330  NA 135  --  133* 131*  K 4.1  --  4.5 4.2  CL 91*  --  96* 90*  CO2 31  --  31 32  GLUCOSE 127*  --  144* 160*  BUN 16  --  15 16  CREATININE 0.79 0.78 0.68 0.71  CALCIUM 9.2  --  8.7* 8.6*  MG  --  1.7  --   --   PHOS  --  3.8  --  3.4  ALKPHOS 62  --   --   --   AST 27  --   --   --   ALT 19  --   --   --   ALBUMIN 3.1*  --   --  2.4*   Chest X-ray 05/24/16 IMPRESSION: No acute cardiopulmonary abnormality. Pulmonary hyperinflation suspected.   Echo Impressions:  - The study acquired during atrial flutter with RVR.   LVEF low normal.   Aortic valve is most probably bicuspid with no significant   stenosis.   There is mild aneurysmal dilatation of the ascending aorta   measuring 41 mm.   RV appears mildly dilated with mildly decreased systolic   function.  Results/Tests Pending at Time of Discharge: final blood culture results. NGTD at time of discharge.  Discharge Medications:  Allergies as of 05/28/2016      Reactions   Penicillins Anaphylaxis, Hives, Rash   Has patient had a PCN reaction causing immediate rash, facial/tongue/throat swelling, SOB or lightheadedness with hypotension: No Has patient had a PCN reaction causing severe rash involving mucus membranes or skin  necrosis: Unk Has patient had a PCN reaction that required hospitalization: Unk Has patient had a PCN reaction occurring within the last 10 years: No If all of the above answers are "NO", then may proceed with Cephalosporin use.      Medication List    TAKE these medications   amiodarone 400 MG tablet Commonly known as:  PACERONE Take 1 tablet (400 mg total) by mouth daily. Take 400 mg daily for two weeks, then decrease to 200 mg daily   predniSONE 50 MG tablet Commonly known as:  DELTASONE Take 1 tablet (50 mg total) by mouth daily with breakfast.   ROBITUSSIN COLD/COUGH/FLU PO Take 5-10 mLs by mouth  every 8 (eight) hours as needed (for flu-like or cold symptoms).   umeclidinium bromide 62.5 MCG/INH Aepb Commonly known as:  INCRUSE ELLIPTA Inhale 1 puff into the lungs daily.       Discharge Instructions: Please refer to Patient Instructions section of EMR for full details.  Patient was counseled important signs and symptoms that should prompt return to medical care, changes in medications, dietary instructions, activity restrictions, and follow up appointments.   Follow-Up Appointments: Follow-up Information    Bhagat,Bhavinkumar, PA Follow up on 06/11/2016.   Specialty:  Cardiology Why:  at 9:30. Please arrive 15 minutes early for your first appointment and bring all of your medications with you.  Contact information: [address] [place] [address] [phone]        Tillman Sers, DO. Go on 06/11/2016.   Why:  at 2:30 pm Please call our office sooner if you feel like you need to be seen earlier. Contact information: [address] [place] [address] [phone]           Tillman Sers, DO 05/29/2016, 9:47 PM PGY-1, [facility]

## 2016-05-26 NOTE — Progress Notes (Signed)
ANTICOAGULATION CONSULT NOTE   Pharmacy Consult for Heparin  Indication: atrial fibrillation  Allergies  Allergen Reactions  . Penicillins Anaphylaxis, Hives and Rash    Has patient had a PCN reaction causing immediate rash, facial/tongue/throat swelling, SOB or lightheadedness with hypotension: No Has patient had a PCN reaction causing severe rash involving mucus membranes or skin necrosis: Unk Has patient had a PCN reaction that required hospitalization: Unk Has patient had a PCN reaction occurring within the last 10 years: No If all of the above answers are "NO", then may proceed with Cephalosporin use.     Patient Measurements: Height: 6\' 4"  (193 cm) Weight: 196 lb 10.4 oz (89.2 kg) IBW/kg (Calculated) : 86.8 Heparin Dosing Weight: 86.5kg = TBW  Vital Signs: Temp: 97.9 F (36.6 C) (02/06 1131) Temp Source: Oral (02/06 1131) BP: 102/82 (02/06 1131) Pulse Rate: 92 (02/06 1131)  Labs:  Recent Labs  05/24/16 1808 05/24/16 2326 05/25/16 0644  05/25/16 2054 05/26/16 0330 05/26/16 1250  HGB 12.3*  --  12.4*  --   --  11.1*  --   HCT 38.6*  --  38.4*  --   --  34.0*  --   PLT 344  --  340  --   --  352  --   HEPARINUNFRC  --   --   --   < > 0.17* 0.23* 0.28*  CREATININE 0.78  --  0.68  --   --  0.71  --   TROPONINI 0.07* 0.08* 0.03*  --   --   --   --   < > = values in this interval not displayed.  Estimated Creatinine Clearance: 116 mL/min (by C-G formula based on SCr of 0.71 mg/dL).   Medical History: Past Medical History:  Diagnosis Date  . Cervical disc disease   . [place] spotted fever age 89  . Tobacco use     Medications:  Prescriptions Prior to Admission  Medication Sig Dispense Refill Last Dose  . Pseudoephedrine-DM-GG-APAP (ROBITUSSIN COLD/COUGH/FLU PO) Take 5-10 mLs by mouth every 8 (eight) hours as needed (for flu-like or cold symptoms).   05/22/2016 at pm   Scheduled:  . albuterol  2.5 mg Nebulization Q6H  . amiodarone  400 mg Oral BID  .  levofloxacin (LEVAQUIN) IV  750 mg Intravenous Q24H  . metoprolol tartrate  25 mg Oral BID  . predniSONE  50 mg Oral Q breakfast  . umeclidinium bromide  1 puff Inhalation Daily    Assessment: 66 y.o male presenting on 05/24/16 with complaint of flulike symptoms, found to be very tachycardic with afib/RVR. Patient is now in NSR.   Heparin level still below goal on 1950 units/hr. Heparin level 0.28. Per RN no issues with infusion, no bleeding.   Goal of Therapy:  Heparin level 0.3-0.7 units/ml Monitor platelets by anticoagulation protocol: Yes   Plan:  Bolus 1000 units  Increase heparin gtt to 2100 units/hr Heparin level in 6 hours  Daily heparin level and CBC Monitor for sxs of bleeding   Allena Katzaroline E Bryson Palen, Pharm.D. PGY1 Pharmacy Resident 2/6/20182:09 PM Pager [phone]

## 2016-05-26 NOTE — Progress Notes (Signed)
ANTICOAGULATION CONSULT NOTE  Pharmacy Consult for Heparin  Indication: atrial fibrillation  Allergies  Allergen Reactions  . Penicillins Anaphylaxis, Hives and Rash    Has patient had a PCN reaction causing immediate rash, facial/tongue/throat swelling, SOB or lightheadedness with hypotension: No Has patient had a PCN reaction causing severe rash involving mucus membranes or skin necrosis: Unk Has patient had a PCN reaction that required hospitalization: Unk Has patient had a PCN reaction occurring within the last 10 years: No If all of the above answers are "NO", then may proceed with Cephalosporin use.     Patient Measurements: Height: $RemoveBeforeD [id number]$6\' 4"(Calculated) : 86.8 Heparin Dosing Weight: 86.5kg = TBW  Vital Signs: Temp: 97.9 F (36.6 C) (02/06 0354) Temp Source: Oral (02/06 0354) BP: 93/70 (02/06 0409) Pulse Rate: 73 (02/06 0400)  Labs:  Recent Labs  05/24/16 1808 05/24/16 2326 05/25/16 0644 05/25/16 1203 05/25/16 2054 05/26/16 0330  HGB 12.3*  --  12.4*  --   --  11.1*  HCT 38.6*  --  38.4*  --   --  34.0*  PLT 344  --  340  --   --  352  HEPARINUNFRC  --   --   --  0.15* 0.17* 0.23*  CREATININE 0.78  --  0.68  --   --  0.71  TROPONINI 0.07* 0.08* 0.03*  --   --   --     Estimated Creatinine Clearance: 116 mL/min (by C-G formula based on SCr of 0.71 mg/dL).  Assessment: 66 y.o. male with Afib for heparin  Goal of Therapy:  Heparin level 0.3-0.7 units/ml Monitor platelets by anticoagulation protocol: Yes   Plan:  Increase Heparin 1950 units/hr Check heparin level in 6 hours.  Geannie RisenGreg Tequita Marrs, PharmD, BCPS  05/26/2016 4:55 AM

## 2016-05-26 NOTE — Progress Notes (Signed)
Progress Note  Patient Name: Jeremiah Carson Date of Encounter: 05/26/2016  Primary Cardiologist: New- Dr. Elease HashimotoNahser  Subjective   Pt denies chest pain this morning. He has wheezing and RT is in the room to give nebulizer treatment.   Inpatient Medications    Scheduled Meds: . albuterol  2.5 mg Nebulization Q6H  . levofloxacin (LEVAQUIN) IV  750 mg Intravenous Q24H  . metoprolol tartrate  25 mg Oral BID  . predniSONE  50 mg Oral Q breakfast  . umeclidinium bromide  1 puff Inhalation Daily  . vancomycin  1,000 mg Intravenous Q8H   Continuous Infusions: . amiodarone 30 mg/hr (05/25/16 2128)  . heparin 1,950 Units/hr (05/26/16 0507)   PRN Meds:    Vital Signs    Vitals:   05/26/16 0400 05/26/16 0409 05/26/16 0500 05/26/16 0822  BP: 93/70 93/70 99/75  98/64  Pulse: 73  74 70  Resp: (!) 23  (!) 22 (!) 23  Temp:    97.6 F (36.4 C)  TempSrc:    Oral  SpO2: 94%  94% 97%  Weight:  196 lb 10.4 oz (89.2 kg)    Height:        Intake/Output Summary (Last 24 hours) at 05/26/16 0945 Last data filed at 05/26/16 0825  Gross per 24 hour  Intake          2102.92 ml  Output             1750 ml  Net           352.92 ml   Filed Weights   05/24/16 1800 05/25/16 0259 05/26/16 0409  Weight: 190 lb 11.2 oz (86.5 kg) 191 lb 9.3 oz (86.9 kg) 196 lb 10.4 oz (89.2 kg)    Telemetry    Atrial flutter with rates in the 70's - Personally Reviewed  ECG    Atrial flutter at 145 bpm, 2:1 conduction - Personally Reviewed  Physical Exam   GEN: No acute distress. Neck: No JVD Cardiac: Mildly Irregularly irregular, no murmurs, rubs, or gallops.  Respiratory: Expiratory wheezes bilaterally GI: Soft, nontender, non-distended  MS: No edema; No deformity. Fingernail and toenail clubbing noted. Neuro:  Nonfocal  Psych: Normal affect   Labs    Chemistry  Recent Labs Lab 05/24/16 1429 05/24/16 1808 05/25/16 0644 05/26/16 0330  NA 135  --  133* 131*  K 4.1  --  4.5 4.2  CL 91*   --  96* 90*  CO2 31  --  31 32  GLUCOSE 127*  --  144* 160*  BUN 16  --  15 16  CREATININE 0.79 0.78 0.68 0.71  CALCIUM 9.2  --  8.7* 8.6*  PROT 6.9  --   --   --   ALBUMIN 3.1*  --   --  2.4*  AST 27  --   --   --   ALT 19  --   --   --   ALKPHOS 62  --   --   --   BILITOT 0.6  --   --   --   GFRNONAA >60 >60 >60 >60  GFRAA >60 >60 >60 >60  ANIONGAP 13  --  6 9     Hematology  Recent Labs Lab 05/24/16 1808 05/25/16 0644 05/26/16 0330  WBC 18.4* 17.7* 19.3*  RBC 3.95* 3.92* 3.48*  HGB 12.3* 12.4* 11.1*  HCT 38.6* 38.4* 34.0*  MCV 97.7 98.0 97.7  MCH 31.1 31.6 31.9  MCHC 31.9 32.3  32.6  RDW 13.0 13.0 12.8  PLT 344 340 352    Cardiac Enzymes  Recent Labs Lab 05/24/16 1429 05/24/16 1808 05/24/16 2326 05/25/16 0644  TROPONINI 0.05* 0.07* 0.08* 0.03*   No results for input(s): TROPIPOC in the last 168 hours.   BNP  Recent Labs Lab 05/24/16 1430  BNP 268.3*     DDimer No results for input(s): DDIMER in the last 168 hours.   Radiology    Dg Chest Port 1 View  Result Date: 05/24/2016 CLINICAL DATA:  66 year old male with shortness of breath and cough for 2 weeks. Delirium yesterday. Initial encounter. EXAM: PORTABLE CHEST 1 VIEW COMPARISON:  None FINDINGS: Portable AP upright view at 1408 hours. Pulmonary hyperinflation suspected. Allowing for portable technique the lungs are clear. No pneumothorax or pleural effusion. Normal cardiac size and mediastinal contours. Visualized tracheal air column is within normal limits. Negative visible bowel gas pattern. IMPRESSION: No acute cardiopulmonary abnormality. Pulmonary hyperinflation suspected. Electronically Signed   By: Odessa Fleming M.D.   On: 05/24/2016 14:20    Cardiac Studies   Echo 05/25/2016 Study Conclusions  - Left ventricle: The cavity size was normal. There was mild   concentric hypertrophy. Systolic function was normal. The   estimated ejection fraction was in the range of 50% to 55%. Wall   motion was  normal; there were no regional wall motion   abnormalities. The study was not technically sufficient to allow   evaluation of LV diastolic dysfunction due to atrial flutter. - Aortic valve: Possibly bicuspid; mildly thickened, mildly   calcified leaflets. - Ascending aorta: The ascending aorta was mildly dilated. - Left atrium: The atrium was normal in size. - Right ventricle: The cavity size was mildly dilated. Wall   thickness was normal. Systolic function was mildly reduced. - Right atrium: The atrium was mildly dilated. - Tricuspid valve: There was mild regurgitation. - Pulmonary arteries: Systolic pressure was within the normal   range. - Inferior vena cava: The vessel was dilated. The respirophasic   diameter changes were blunted (< 50%), consistent with elevated   central venous pressure.  Impressions:  - The study acquired during atrial flutter with RVR.   LVEF low normal.   Aortic valve is most probably bicuspid with no significant   stenosis.   There is mild aneurysmal dilatation of the ascending aorta   measuring 41 mm.   RV appears mildly dilated with mildly decreased systolic   function.  Patient Profile     66 y.o. male with past medical history significant for >50 Pack year smoking history, [place] Spotted fever at age 65 and cervical disc disease who presented to the [facility] ED for evaluation of flulike symptoms. He was found to be in atrial flutter with rapid RVR.   Assessment & Plan    Atrial flutter -Pt has had flulike symptoms for over 2 weeks, on Sunday felt weak and lightheaded when tried to take a shower. He has had occasional heart fluttering over the last week. Has mild chest tightness worse with coughing.  -Troponins 0.05, 0.07, 0.08, 0.03. Mildly elevated, likely demand ischemia in setting of respiratory illness and atrial flutter tachycardia -BNP 268.3, TSH 0.437 -Echo: EF 50-55%, normal LA, mildly dilated RA. Mild LVH, no regional wall  motion abnormalities -CXR: No acute cardiopulmonary abnormality. Pulmonary hyperinflation suspected -Initially given IV cardizem, but became hypotensive. Started on IV  amiodarone for the short term and beta blocker. IV fluids given.  -Continues to be in  atrial flutter with rates well controlled in the 70's-80's. BP still soft with SBP in the 90's. No room to increase beta blocker.  -This patient's CHA2DS2-VASc Score is 0 with the little medical history that is available. Will check echo for LV function and wall motion. Will need anticoagulation for 4 weeks if planning DCCV, but atrial flutter may self convert with resolution of current illness. Pt is on IV heparin now and we will revisit anticoagulation prior to discharge. Pt has had blood in stool and hematuria, may need to discontinue heparin if develops significant bleeding. Hgb is stable. -Continue metoprolol 25 mg bid. BP soft with no room for increase in beta blocker. Will discuss amiodarone continuation with cardiologist. -Patient states does not like to take medications and does not plan to follow medical therapy, however, he says his wife will probably make him. Question of whether he would be compliant with amiodarone and anticoagulation. May need to send home on rate control and no anticoagulation with no plan for DCCV. Will need to speak with patient and wife about plan.  Sepsis  -Flulike symptoms with sepsis - cough, sputum production, weakness, diarrhea, nausea, poor PO intake. Lactic acid peaked at 2.1, down to 1.2  Leukocytosis- WBC 17.7-->19.3, but increase could be related to steroid therapy. -Negative for influenza A and B. Respiratory panel negative. -Likely viral illness and COPD exacerbation in setting of long time smoker. -Managed by IM with antibiotics, steroids and breathing treatments.,  Tobacco use -Greater than 50 pack years -Reports that he quit smoking and drinking tea and coffee 1 week ago. Encouraged continued tobacco  cessation. He says that he is done with cigarettes.   Signed, Berton Bon, NP  05/26/2016, 9:45 AM     Attending Note:   The patient was seen and examined.  Agree with assessment and plan as noted above.  Changes made to the above note as needed.  Patient seen and independently examined with Lizabeth Leyden, NP .   We discussed all aspects of the encounter. I agree with the assessment and plan as stated above.  1. Atrial fib:   Rate is well controlled at present. Will transition from IV amio to PO amio He is on heparin .   Case manager to help [place] decide if he can take a DOAC vs. Coumadin. He does not like to take medicines long-term and does not think that he would want to come get his levels measured in the office. It sounds like he has a good chance of being noncompliant with the whole issue of atrial fibrillation.  I hesitate to do a TEE guided cardioversion because of his reluctance to take medications. I'm not convinced that he going to take the blood thinner or the amiodarone on a long-term basis.   I have spent a total of 40 minutes with patient reviewing hospital  notes , telemetry, EKGs, labs and examining patient as well as establishing an assessment and plan that was discussed with the patient. > 50% of time was spent in direct patient care.    Vesta Mixer, Montez Hageman., MD, [facility] 05/26/2016, 11:25 AM [address],  [address] Office [phone] Pager [phone]

## 2016-05-26 NOTE — Progress Notes (Signed)
Family Medicine Teaching Service Daily Progress Note Intern Pager: [phone]  Patient name: Jeremiah Carson Medical record number: [id number] Date of birth: 22-May-1950 Age: 66 y.o. Gender: male  Primary Care Provider: Pcp Not In System Consultants: cardiology Code Status: full  Pt Overview and Major Events to Date:  05/24/16- admitted to FPTS  Assessment and Plan: Jeremiah Carson is a 66 y.o. male presenting with flu-like illness and dyspnea. PMH is significant for h/o RMSF and tobacco use(recently quit).  #Sepsis- resolved. qSOFA score on admission of 2 putting patient at high risk. Patient met sepsis criteria on admission with eukocytosis to 17 with left shift. LA initially normal at 1.51. Flu panel negative. Patient with normal CXR, evidence of hyperinflated lungs. UA normal. RVP negative. Patient on Levaquin and Vancomycin. Leukocytosis worsening but patient on steroids. Has remained afebrile.  -blood cx no growth x 1 day -continue antibiotics and tamiflu -de-escalate antibiotics as able -leukocytosis worsening but patient on steroids  #Dyspnea- improving. No known lung disease. Now with new oxygen requirement. ABG 7.4/50/71/33. Etiology appears to be multifactorial. Differential includes COPD exacerbation vs viral illness vs CAP. Patient with extensive history of smoking. Recently quit 1 week ago cold [place]. CHF ruled out with normal echo. Flu negative. Chest x-ray unremarkable. -echo with normal EF, mild TR, no evidence of heart failure -daily weights -monitor I/O -SLIV in setting of fluid overload -scheduled albuterol neb today q6 -added Incruse inhaler 2/5 (LAMA) -continue prednisone, on day 2/5 -continue antibiotics, plan to de-escalate today to cover CAP -continue Tamiflu  #A.fib with RVR. New-onset. CHADsVASC score of 0. HR 70-80's this morning in regular rhythm -continue to monitor on telemetry -cardiology following, appreciate recommendations -continue heparin  gtt, monitor for bleeding in patient with remote history of bleeds, hemoglobin stable at 11.1 -FOBT collected, pending -monitor phos and mag  #Elevated Troponin. Initial troponin 0.05. EKG only showing A.fib. Patient denies any chest pain. No cardiac history. Risk factors for ACS included tobacco use. -trend troponins: 0.07>0.08>0.03  -patient already on heparin drip -cardiology following  Rectal bleeding- reported on admission. FOBT negative. Likely due to diarrhea aggravating hemorrhoids. -monitor cbc  FEN/GI: HH diet Prophylaxis: heparin gtt  Disposition: plan to transfer out of SDU as able  Subjective:  Jeremiah Carson is doing well this morning, feels much improved. Has some SOB with walking around. No fevers or chills. No chest pain.  Objective: Temp:  [97.6 F (36.4 C)-98.4 F (36.9 C)] 97.6 F (36.4 C) (02/06 0822) Pulse Rate:  [70-104] 70 (02/06 0822) Resp:  [14-29] 23 (02/06 0822) BP: (83-125)/(59-83) 98/64 (02/06 0822) SpO2:  [89 %-97 %] 97 % (02/06 0822) Weight:  [196 lb 10.4 oz (89.2 kg)] 196 lb 10.4 oz (89.2 kg) (02/06 0409) Physical Exam: General: elderly man sitting up in bed in NAD Cardiovascular: RRR no MRG, +2 radial pulses Respiratory: no increased work of breathing. Wheezes auscultated bilaterally with coarse breath sounds, poor air movement Abdomen: soft, non-tender, non-distended, +BS Rectal: non-tender, good tone, no blood noted, no masses palpated Extremities: no edema or cyanosis  Laboratory:  Recent Labs Lab 05/24/16 1808 05/25/16 0644 05/26/16 0330  WBC 18.4* 17.7* 19.3*  HGB 12.3* 12.4* 11.1*  HCT 38.6* 38.4* 34.0*  PLT 344 340 352    Recent Labs Lab 05/24/16 1429 05/24/16 1808 05/25/16 0644 05/26/16 0330  NA 135  --  133* 131*  K 4.1  --  4.5 4.2  CL 91*  --  96* 90*  CO2 31  --  31 32  BUN 16  --  15 16  CREATININE 0.79 0.78 0.68 0.71  CALCIUM 9.2  --  8.7* 8.6*  PROT 6.9  --   --   --   BILITOT 0.6  --   --   --    ALKPHOS 62  --   --   --   ALT 19  --   --   --   AST 27  --   --   --   GLUCOSE 127*  --  144* 160*    Lactic acid 1.5>2.1>1.2 TSH 0.437 Trop 0.08>0.03  Imaging/Diagnostic Tests: No results found.  TTE: Study Conclusions  - Left ventricle: The cavity size was normal. There was mild   concentric hypertrophy. Systolic function was normal. The   estimated ejection fraction was in the range of 50% to 55%. Wall   motion was normal; there were no regional wall motion   abnormalities. The study was not technically sufficient to allow   evaluation of LV diastolic dysfunction due to atrial flutter. - Aortic valve: Possibly bicuspid; mildly thickened, mildly   calcified leaflets. - Ascending aorta: The ascending aorta was mildly dilated. - Left atrium: The atrium was normal in size. - Right ventricle: The cavity size was mildly dilated. Wall   thickness was normal. Systolic function was mildly reduced. - Right atrium: The atrium was mildly dilated. - Tricuspid valve: There was mild regurgitation. - Pulmonary arteries: Systolic pressure was within the normal   range. - Inferior vena cava: The vessel was dilated. The respirophasic   diameter changes were blunted (< 50%), consistent with elevated   central venous pressure.  Impressions:  - The study acquired during atrial flutter with RVR.   LVEF low normal.   Aortic valve is most probably bicuspid with no significant   stenosis.   There is mild aneurysmal dilatation of the ascending aorta   measuring 41 mm.   RV appears mildly dilated with mildly decreased systolic   function.   Steve Rattler, DO 05/26/2016, 9:40 AM PGY-1, [facility] Intern pager: [phone], text pages welcome

## 2016-05-26 NOTE — Care Management Note (Addendum)
Case Management Note  Patient Details  Name: Jeremiah Carson MRN: [id number] Date of Birth: 02-25-1951  Subjective/Objective:   Presents with sepsis, dyspnea, afib with rvr, elevated trops, gib,  Started on amio drip and heparin,  Has PCP Dr. Modesto CharonWong, pta indep, he has transportation at dc, has no problems getting medications.   NCM will cont to follow for dc needs.               Action/Plan:   Expected Discharge Date:                  Expected Discharge Plan:  Home/Self Care  In-House Referral:     Discharge planning Services  CM Consult  Post Acute Care Choice:    Choice offered to:     DME Arranged:    DME Agency:     HH Arranged:    HH Agency:     Status of Service:  In process, will continue to follow  If discussed at Long Length of Stay Meetings, dates discussed:    Additional Comments:  Leone Havenaylor, Fatima Fedie Clinton, RN 05/26/2016, 12:38 PM

## 2016-05-27 LAB — CBC
HCT: 33.7 % — ABNORMAL LOW (ref 39.0–52.0)
HEMOGLOBIN: 10.9 g/dL — AB (ref 13.0–17.0)
MCH: 31.2 pg (ref 26.0–34.0)
MCHC: 32.3 g/dL (ref 30.0–36.0)
MCV: 96.6 fL (ref 78.0–100.0)
Platelets: 356 10*3/uL (ref 150–400)
RBC: 3.49 MIL/uL — AB (ref 4.22–5.81)
RDW: 12.7 % (ref 11.5–15.5)
WBC: 11.6 10*3/uL — ABNORMAL HIGH (ref 4.0–10.5)

## 2016-05-27 LAB — HEPARIN LEVEL (UNFRACTIONATED): HEPARIN UNFRACTIONATED: 0.69 [IU]/mL (ref 0.30–0.70)

## 2016-05-27 MED ORDER — LEVOFLOXACIN 750 MG PO TABS
750.0000 mg | ORAL_TABLET | Freq: Every day | ORAL | Status: DC
  Administered 2016-05-27: 750 mg via ORAL
  Filled 2016-05-27: qty 1

## 2016-05-27 NOTE — Progress Notes (Signed)
Pt arrived on [facility] Unit; pt has been oriented to room, surroundings, and any applicable equipment.  CCMD notified of telemetry. Pt has been educated on the AT&TFalls Prevention Plan.  Pt in stable condition. VS were not obtained d/t pt arrival at 1852.  Pt is however on tele monitor 3E07 and CCMD notified.   Will report to PM shift that pt needs vital signs taken, and a standing weight done.

## 2016-05-27 NOTE — Progress Notes (Signed)
Medication Samples have been provided to the patient.  Drug: albuterol HFA inhaler (Proventil) Qty: 1  LOT: [id number]  Exp.Date: aug 2019 Dosing instructions: Inhale 1-2 puffs into lungs every 4-6 hours as needed for shortness of breath  The patient has been instructed regarding the correct time, dose, and frequency of taking this medication, including desired effects and most common side effects.   Samples signed for by Devota Pacearoline Welles, PharmD (on behalf of Dr. Wonda Oldsiccio)  Allena Katzaroline E Welles, Pharm.D. PGY1 Pharmacy Resident 2/7/20182:44 PM Pager [phone]

## 2016-05-27 NOTE — Progress Notes (Signed)
Pharmacy Antibiotic Note  Jeremiah Carson is a 66 y.o. male admitted on 05/24/2016 presenting with complaint of flulike symptoms, found to be very tachycardic with afib/RVR.  Pharmacy has been consulted for levofloxacin dosing for CAP. SCr stable ~0.7, CrCl ~90, WBC down to 11.6, Afebrile.   Plan: Levofloxacin 750mg  PO Q24    Monitor clinical stauts, renal function, culture results. F/u LOT     Height: 6\' 4"  (193 cm) Weight: 194 lb 0.1 oz (88 kg) IBW/kg (Calculated) : 86.8  Temp (24hrs), Avg:98.1 F (36.7 C), Min:97.6 F (36.4 C), Max:98.9 F (37.2 C)   Recent Labs Lab 05/24/16 1429 05/24/16 1434 05/24/16 1808 05/24/16 2021 05/24/16 2325 05/25/16 0644 05/25/16 0936 05/26/16 0330 05/27/16 0306  WBC 17.0*  --  18.4*  --   --  17.7*  --  19.3* 11.6*  CREATININE 0.79  --  0.78  --   --  0.68  --  0.71  --   LATICACIDVEN  --  1.52  --  1.5 2.1* 1.2 1.2  --   --     Estimated Creatinine Clearance: 116 mL/min (by C-G formula based on SCr of 0.71 mg/dL).    Allergies  Allergen Reactions  . Penicillins Anaphylaxis, Hives and Rash    Has patient had a PCN reaction causing immediate rash, facial/tongue/throat swelling, SOB or lightheadedness with hypotension: No Has patient had a PCN reaction causing severe rash involving mucus membranes or skin necrosis: Unk Has patient had a PCN reaction that required hospitalization: Unk Has patient had a PCN reaction occurring within the last 10 years: No If all of the above answers are "NO", then may proceed with Cephalosporin use.     Antimicrobials this admission:  Vancomycin 2/4>>2/6 Levofloxacin 2/4>>  Dose adjustments this admission:   Microbiology results: 2/4 BCx: ngtd x2d 2/4 resp panel: negative 2/4 MRSA PCR: negative  Thank you for allowing pharmacy to be a part of this patient's care.  Allena Katzaroline E Welles, Pharm.D. PGY1 Pharmacy Resident 2/7/20186:47 AM Pager [phone]

## 2016-05-27 NOTE — Progress Notes (Signed)
Family Medicine Teaching Service Daily Progress Note Intern Pager: [phone]  Patient name: Jeremiah Carson Medical record number: [id number] Date of birth: 1950-09-04 Age: 66 y.o. Gender: male  Primary Care Provider: Pcp Not In System Consultants: cardiology Code Status: full  Pt Overview and Major Events to Date:  05/24/16- admitted to FPTS  Assessment and Plan: Jeremiah Carson is a 66 y.o. male presenting with flu-like illness and dyspnea. PMH is significant for h/o RMSF and tobacco use(recently quit).  #Sepsis- secondary to CAP- resolved. qSOFA score on admission of 2 putting patient at high risk. Patient met sepsis criteria on admission with eukocytosis to 17 with left shift. LA initially normal at 1.51. Flu panel negative. Patient with normal CXR, evidence of hyperinflated lungs. UA normal. RVP negative. Patient on Levaquin and Vancomycin. Leukocytosis worsening but patient on steroids. Has remained afebrile.  -blood cx no growth x 2 day -continue levaquin and tamiflu -leukocytosis improved today to 11.6  #Dyspnea- improving. No known lung disease. Now with new oxygen requirement. ABG 7.4/50/71/33. Etiology appears to be multifactorial. Differential includes COPD exacerbation vs viral illness vs CAP. Patient with extensive history of smoking. Recently quit 1 week ago cold [place]. CHF ruled out with normal echo. Flu negative. Chest x-ray unremarkable. Echo with normal EF, mild TR, no evidence of heart failure -daily weights -monitor I/O -albuterol nebulizer q6 hours scheduled -continue Incruse  -continue prednisone, on day 3/5 -continue Levaquin for CAP -continue Tamiflu  #A.fib with RVR. New-onset. CHADsVASC score of 0. HR 80's today, in flutter -continue to monitor on telemetry -cardiology following, appreciate recommendations -continue heparin gtt, monitor for bleeding in patient with remote history of bleeds, hemoglobin stable at 11.1 -continue amiodarone  FEN/GI:  HH diet Prophylaxis: heparin gtt  Disposition: plan to transfer out of SDU as able  Subjective:  Jeremiah Carson is doing well, denies SOB and chest pain. Is not convinced his SOB is due to heart issues or smoking history.  Objective: Temp:  [97.7 F (36.5 C)-98.9 F (37.2 C)] 98.7 F (37.1 C) (02/07 0732) Pulse Rate:  [73-107] 80 (02/07 0732) Resp:  [18-28] 28 (02/07 0732) BP: (88-138)/(55-118) 101/88 (02/07 0732) SpO2:  [90 %-94 %] 91 % (02/07 0744) Weight:  [194 lb 0.1 oz (88 kg)] 194 lb 0.1 oz (88 kg) (02/07 0458) Physical Exam: General: elderly man sitting up in bed in NAD Cardiovascular: irregular rhythm, regular rate, no MRG. +2 radial pulses Respiratory: no increased work of breathing. Faint wheezes in bilateral lung bases Abdomen: soft, non-tender, non-distended, +BS Rectal: non-tender, good tone, no blood noted, no masses palpated Extremities: no edema or cyanosis  Laboratory:  Recent Labs Lab 05/25/16 0644 05/26/16 0330 05/27/16 0306  WBC 17.7* 19.3* 11.6*  HGB 12.4* 11.1* 10.9*  HCT 38.4* 34.0* 33.7*  PLT 340 352 356    Recent Labs Lab 05/24/16 1429 05/24/16 1808 05/25/16 0644 05/26/16 0330  NA 135  --  133* 131*  K 4.1  --  4.5 4.2  CL 91*  --  96* 90*  CO2 31  --  31 32  BUN 16  --  15 16  CREATININE 0.79 0.78 0.68 0.71  CALCIUM 9.2  --  8.7* 8.6*  PROT 6.9  --   --   --   BILITOT 0.6  --   --   --   ALKPHOS 62  --   --   --   ALT 19  --   --   --   AST 27  --   --   --  GLUCOSE 127*  --  144* 160*    Lactic acid 1.5>2.1>1.2 TSH 0.437 Trop 0.08>0.03  Imaging/Diagnostic Tests: No results found.  TTE: Study Conclusions  - Left ventricle: The cavity size was normal. There was mild   concentric hypertrophy. Systolic function was normal. The   estimated ejection fraction was in the range of 50% to 55%. Wall   motion was normal; there were no regional wall motion   abnormalities. The study was not technically sufficient to allow    evaluation of LV diastolic dysfunction due to atrial flutter. - Aortic valve: Possibly bicuspid; mildly thickened, mildly   calcified leaflets. - Ascending aorta: The ascending aorta was mildly dilated. - Left atrium: The atrium was normal in size. - Right ventricle: The cavity size was mildly dilated. Wall   thickness was normal. Systolic function was mildly reduced. - Right atrium: The atrium was mildly dilated. - Tricuspid valve: There was mild regurgitation. - Pulmonary arteries: Systolic pressure was within the normal   range. - Inferior vena cava: The vessel was dilated. The respirophasic   diameter changes were blunted (< 50%), consistent with elevated   central venous pressure.  Impressions:  - The study acquired during atrial flutter with RVR.   LVEF low normal.   Aortic valve is most probably bicuspid with no significant   stenosis.   There is mild aneurysmal dilatation of the ascending aorta   measuring 41 mm.   RV appears mildly dilated with mildly decreased systolic   function.   Steve Rattler, DO 05/27/2016, 9:26 AM PGY-1, [facility] Intern pager: [phone], text pages welcome

## 2016-05-27 NOTE — Progress Notes (Signed)
Progress Note  Patient Name: Jeremiah Carson Date of Encounter: 05/27/2016  Primary Cardiologist: New- Dr. Elease Hashimoto  Subjective   Pt denies chest pain, dyspnea or lightheadedness. BP is running low but he denies ay symptoms.   Inpatient Medications    Scheduled Meds: . albuterol  2.5 mg Nebulization TID  . amiodarone  400 mg Oral BID  . levofloxacin  750 mg Oral q1800  . metoprolol tartrate  25 mg Oral BID  . predniSONE  50 mg Oral Q breakfast  . umeclidinium bromide  1 puff Inhalation Daily   Continuous Infusions: . heparin 2,000 Units/hr (05/27/16 0800)   PRN Meds:    Vital Signs    Vitals:   05/27/16 0458 05/27/16 0732 05/27/16 0744 05/27/16 1133  BP: (!) 138/118 101/88  (!) 86/69  Pulse: 73 80  83  Resp: (!) 22 (!) 28  (!) 22  Temp: 98.2 F (36.8 C) 98.7 F (37.1 C)  97.5 F (36.4 C)  TempSrc: Oral Oral  Oral  SpO2: 94% 92% 91% 91%  Weight: 194 lb 0.1 oz (88 kg)     Height:        Intake/Output Summary (Last 24 hours) at 05/27/16 1202 Last data filed at 05/27/16 0800  Gross per 24 hour  Intake              762 ml  Output             2500 ml  Net            -1738 ml   Filed Weights   05/25/16 0259 05/26/16 0409 05/27/16 0458  Weight: 191 lb 9.3 oz (86.9 kg) 196 lb 10.4 oz (89.2 kg) 194 lb 0.1 oz (88 kg)    Telemetry    Atrial fibrillation  - Personally Reviewed  ECG    Atrial flutter at 145 bpm, 2:1 conduction - Personally Reviewed  Physical Exam   GEN: No acute distress. Neck: No JVD Cardiac: Irregularly irregular rhythm, no murmurs, rubs, or gallops.  Respiratory: Expiratory wheezes bilaterally GI: Soft, nontender, non-distended  MS: Trace ankle edema; No deformity. Fingernail and toenail clubbing noted. Neuro:  Nonfocal  Psych: Normal affect   Labs    Chemistry  Recent Labs Lab 05/24/16 1429 05/24/16 1808 05/25/16 0644 05/26/16 0330  NA 135  --  133* 131*  K 4.1  --  4.5 4.2  CL 91*  --  96* 90*  CO2 31  --  31 32    GLUCOSE 127*  --  144* 160*  BUN 16  --  15 16  CREATININE 0.79 0.78 0.68 0.71  CALCIUM 9.2  --  8.7* 8.6*  PROT 6.9  --   --   --   ALBUMIN 3.1*  --   --  2.4*  AST 27  --   --   --   ALT 19  --   --   --   ALKPHOS 62  --   --   --   BILITOT 0.6  --   --   --   GFRNONAA >60 >60 >60 >60  GFRAA >60 >60 >60 >60  ANIONGAP 13  --  6 9     Hematology  Recent Labs Lab 05/25/16 0644 05/26/16 0330 05/27/16 0306  WBC 17.7* 19.3* 11.6*  RBC 3.92* 3.48* 3.49*  HGB 12.4* 11.1* 10.9*  HCT 38.4* 34.0* 33.7*  MCV 98.0 97.7 96.6  MCH 31.6 31.9 31.2  MCHC 32.3 32.6 32.3  RDW 13.0 12.8 12.7  PLT 340 352 356    Cardiac Enzymes  Recent Labs Lab 05/24/16 1429 05/24/16 1808 05/24/16 2326 05/25/16 0644  TROPONINI 0.05* 0.07* 0.08* 0.03*   No results for input(s): TROPIPOC in the last 168 hours.   BNP  Recent Labs Lab 05/24/16 1430  BNP 268.3*     DDimer No results for input(s): DDIMER in the last 168 hours.   Radiology    No results found.  Cardiac Studies   Echo 05/25/2016 Study Conclusions  - Left ventricle: The cavity size was normal. There was mild   concentric hypertrophy. Systolic function was normal. The   estimated ejection fraction was in the range of 50% to 55%. Wall   motion was normal; there were no regional wall motion   abnormalities. The study was not technically sufficient to allow   evaluation of LV diastolic dysfunction due to atrial flutter. - Aortic valve: Possibly bicuspid; mildly thickened, mildly   calcified leaflets. - Ascending aorta: The ascending aorta was mildly dilated. - Left atrium: The atrium was normal in size. - Right ventricle: The cavity size was mildly dilated. Wall   thickness was normal. Systolic function was mildly reduced. - Right atrium: The atrium was mildly dilated. - Tricuspid valve: There was mild regurgitation. - Pulmonary arteries: Systolic pressure was within the normal   range. - Inferior vena cava: The vessel  was dilated. The respirophasic   diameter changes were blunted (< 50%), consistent with elevated   central venous pressure.  Impressions:  - The study acquired during atrial flutter with RVR.   LVEF low normal.   Aortic valve is most probably bicuspid with no significant   stenosis.   There is mild aneurysmal dilatation of the ascending aorta   measuring 41 mm.   RV appears mildly dilated with mildly decreased systolic   function.  Patient Profile     66 y.o. male with past medical history significant for >50 Pack year smoking history, [place] Spotted fever at age 89 and cervical disc disease who [facility] evaluation of flulike symptoms. He was found to be in atrial flutter with rapid RVR.   Assessment & Plan    Atrial flutter/fib -Unknown duration -Pt has had flulike symptoms for over 2 weeks, on Sunday felt weak and lightheaded when tried to take a shower. He has had occasional heart fluttering over the last week. Has mild chest tightness worse with coughing.  -Troponins 0.05, 0.07, 0.08, 0.03. Mildly elevated, likely demand ischemia in setting of respiratory illness and atrial flutter tachycardia -BNP 268.3, TSH 0.437 -Echo: EF 50-55%, normal LA, mildly dilated RA. Mild LVH, no regional wall motion abnormalities -CXR: No acute cardiopulmonary abnormality. Pulmonary hyperinflation suspected -Initially given IV cardizem, but became hypotensive. Started on IV  amiodarone for the short term and beta blocker. IV fluids given.  -Continues to be in atrial fib/flutter with rates well controlled in the 70's-80's. BP still soft. No room to increase beta blocker.  -This patient's CHA2DS2-VASc Score is 0 with the little medical history that is available. He has expressed reluctance to take medications. I had a long discussion with him and his wife explaining the options of rate control with no need for anticoagulation vs in-house TEE/cardioversion with 1 month  anticoagulation vs outpatient DCCV with anticoagulation 1 month before and after. He says that he is now thinking that he will be compliant with medications including anticoagulation as at his age he  knows that he needs to take better care of himself. Wife is not 100% convinced that he will comply but she will try to help. Wife thinks that he would be more compliant with DOAC than coumadin. Pt and wife will think about options and discuss with Dr. Elease Hashimoto. -Continue metoprolol 25 mg bid. BP soft with no room for increase in beta blocker. Plan to continue amiodarone for now as may be considering DCCV. Continue heparin until decision made.   Sepsis  -Flulike symptoms with sepsis - cough, sputum production, weakness, diarrhea, nausea, poor PO intake. Lactic acid peaked at 2.1, down to 1.2  Leukocytosis- WBC 17.7-->19.3-->11.6 -Negative for influenza A and B. Respiratory panel negative. -Likely viral illness and COPD exacerbation in setting of long time smoker. -Managed by IM with antibiotics, steroids and breathing treatments.  Tobacco use -Greater than 50 pack years -Reports that he quit smoking and drinking tea and coffee 1 week ago. Encouraged continued tobacco cessation. He says that he is done with cigarettes.   Signed, Berton Bon, NP  05/27/2016, 12:02 PM     Attending Note:   The patient was seen and examined.  Agree with assessment and plan as noted above.  Changes made to the above note as needed.  Patient seen and independently examined with Lizabeth Leyden, NP .   We discussed all aspects of the encounter. I agree with the assessment and plan as stated above.  1.   Atrial flutter:   He now states that he will be compliant with anticoagulation . Will try to schedule a TEE cardioversion for Friday . If we cannot, he can go home and return in 3 weeks for elective CV HR is well controlled.      I have spent a total of 40 minutes with patient reviewing hospital  notes , telemetry,  EKGs, labs and examining patient as well as establishing an assessment and plan that was discussed with the patient. > 50% of time was spent in direct patient care.    Vesta Mixer, Montez Hageman., MD, [facility] 05/27/2016, 4:25 PM [address],  [address] Office [phone] Pager [phone]

## 2016-05-27 NOTE — Progress Notes (Signed)
Pt ambulated in the hall independently [id number]. Pt was on room air and tolerated well. Marisue Ivanobyn Tashai Catino RN

## 2016-05-27 NOTE — Progress Notes (Signed)
ANTICOAGULATION CONSULT NOTE   Pharmacy Consult for Heparin  Indication: atrial fibrillation  Allergies  Allergen Reactions  . Penicillins Anaphylaxis, Hives and Rash    Has patient had a PCN reaction causing immediate rash, facial/tongue/throat swelling, SOB or lightheadedness with hypotension: No Has patient had a PCN reaction causing severe rash involving mucus membranes or skin necrosis: Unk Has patient had a PCN reaction that required hospitalization: Unk Has patient had a PCN reaction occurring within the last 10 years: No If all of the above answers are "NO", then may proceed with Cephalosporin use.     Patient Measurements: Height: 6\' 4"  (193 cm) Weight: 194 lb 0.1 oz (88 kg) IBW/kg (Calculated) : 86.8 Heparin Dosing Weight: 86.5kg = TBW  Vital Signs: Temp: 98.2 F (36.8 C) (02/07 0458) Temp Source: Oral (02/07 0458) BP: 138/118 (02/07 0458) Pulse Rate: 73 (02/07 0458)  Labs:  Recent Labs  05/24/16 1808 05/24/16 2326 05/25/16 0644  05/26/16 0330 05/26/16 1250 05/26/16 2029 05/27/16 0306  HGB 12.3*  --  12.4*  --  11.1*  --   --  10.9*  HCT 38.6*  --  38.4*  --  34.0*  --   --  33.7*  PLT 344  --  340  --  352  --   --  356  HEPARINUNFRC  --   --   --   < > 0.23* 0.28* 0.76* 0.69  CREATININE 0.78  --  0.68  --  0.71  --   --   --   TROPONINI 0.07* 0.08* 0.03*  --   --   --   --   --   < > = values in this interval not displayed.  Estimated Creatinine Clearance: 116 mL/min (by C-G formula based on SCr of 0.71 mg/dL).   Medical History: Past Medical History:  Diagnosis Date  . Cervical disc disease   . [place] spotted fever age 89  . Tobacco use     Medications:  Prescriptions Prior to Admission  Medication Sig Dispense Refill Last Dose  . Pseudoephedrine-DM-GG-APAP (ROBITUSSIN COLD/COUGH/FLU PO) Take 5-10 mLs by mouth every 8 (eight) hours as needed (for flu-like or cold symptoms).   05/22/2016 at pm   Scheduled:  . albuterol  2.5 mg  Nebulization TID  . amiodarone  400 mg Oral BID  . levofloxacin  750 mg Oral q1800  . metoprolol tartrate  25 mg Oral BID  . predniSONE  50 mg Oral Q breakfast  . umeclidinium bromide  1 puff Inhalation Daily    Assessment: 66 y.o male presenting on 05/24/16 with complaint of flulike symptoms, found to be very tachycardic with afib/RVR. Patient spontaneously cardioverted into NSR, however this AM is found to be back in atrial flutter.  Heparin level at goal on 2000 units/hr. Per RN no issues with infusion, no bleeding. CBC stable.  Goal of Therapy:  Heparin level 0.3-0.7 units/ml Monitor platelets by anticoagulation protocol: Yes   Plan:  Continue heparin gtt at 2000 units/hr Daily heparin level and CBC Monitor for sxs of bleeding  F/u switch to PO AC prior to discharge   Allena Katzaroline E Keyarra Rendall, Pharm.D. PGY1 Pharmacy Resident 2/7/20186:41 AM Pager [phone]

## 2016-05-28 LAB — HEPARIN LEVEL (UNFRACTIONATED)
HEPARIN UNFRACTIONATED: 0.79 [IU]/mL — AB (ref 0.30–0.70)
Heparin Unfractionated: 0.67 IU/mL (ref 0.30–0.70)

## 2016-05-28 LAB — CBC
HCT: 36.3 % — ABNORMAL LOW (ref 39.0–52.0)
Hemoglobin: 11.5 g/dL — ABNORMAL LOW (ref 13.0–17.0)
MCH: 30.8 pg (ref 26.0–34.0)
MCHC: 31.7 g/dL (ref 30.0–36.0)
MCV: 97.3 fL (ref 78.0–100.0)
Platelets: 378 10*3/uL (ref 150–400)
RBC: 3.73 MIL/uL — ABNORMAL LOW (ref 4.22–5.81)
RDW: 13 % (ref 11.5–15.5)
WBC: 10.5 10*3/uL (ref 4.0–10.5)

## 2016-05-28 MED ORDER — PREDNISONE 50 MG PO TABS: 50.0000 mg | ORAL_TABLET | Freq: Every day | ORAL | 0 refills | Status: AC

## 2016-05-28 MED ORDER — LEVOFLOXACIN 750 MG PO TABS
750.0000 mg | ORAL_TABLET | Freq: Every day | ORAL | Status: DC
  Administered 2016-05-28: 750 mg via ORAL
  Filled 2016-05-28: qty 1

## 2016-05-28 MED ORDER — ALBUTEROL SULFATE (2.5 MG/3ML) 0.083% IN NEBU: 2.5000 mg | INHALATION_SOLUTION | Freq: Two times a day (BID) | RESPIRATORY_TRACT | Status: DC

## 2016-05-28 MED ORDER — UMECLIDINIUM BROMIDE 62.5 MCG/INH IN AEPB: 1.0000 | INHALATION_SPRAY | Freq: Every day | RESPIRATORY_TRACT | 3 refills | Status: DC

## 2016-05-28 MED ORDER — ALBUTEROL SULFATE (2.5 MG/3ML) 0.083% IN NEBU: 2.5000 mg | INHALATION_SOLUTION | RESPIRATORY_TRACT | Status: DC | PRN

## 2016-05-28 MED ORDER — AMIODARONE HCL 400 MG PO TABS: 400.0000 mg | ORAL_TABLET | Freq: Every day | ORAL | 0 refills | Status: DC

## 2016-05-28 NOTE — Progress Notes (Signed)
Patient rested well overnight. No complaints of breathing problems. Patient in bed resting.

## 2016-05-28 NOTE — Progress Notes (Addendum)
RE: Benefit check   Message Contents  Mardene SayerDora Greenlee CMA        S/W JULIE @ [employer] RX # [phone]   1.ELIQUIS 5 MG BID  COVER- YES  CO-PAY- 20 % OF COST  PRIOR APPROVAL- NO   2 XARELTO 15 MG DAILY  COVER- YES  CO-PAY - 20 % OF COST  PRIOR APPROVAL- NO   PHARMACY : [facility]   ANY RX FOR RETAIL 0R MAIL-ORDER COST IS 20 %

## 2016-05-28 NOTE — Progress Notes (Signed)
SATURATION QUALIFICATIONS: (This note is used to comply with regulatory documentation for home oxygen)  Patient Saturations on Room Air at Rest = 94%  Patient Saturations on Room Air while Ambulating = 90%  Patient Saturations on 0 Liters of oxygen while Ambulating = 90%  Please briefly explain why patient needs home oxygen:  Pt does not qualify for home O2, as his O2 sats did not below 88%.

## 2016-05-28 NOTE — Progress Notes (Signed)
ANTICOAGULATION CONSULT NOTE   Pharmacy Consult for Heparin  Indication: atrial fibrillation  Allergies  Allergen Reactions  . Penicillins Anaphylaxis, Hives and Rash    Has patient had a PCN reaction causing immediate rash, facial/tongue/throat swelling, SOB or lightheadedness with hypotension: No Has patient had a PCN reaction causing severe rash involving mucus membranes or skin necrosis: Unk Has patient had a PCN reaction that required hospitalization: Unk Has patient had a PCN reaction occurring within the last 10 years: No If all of the above answers are "NO", then may proceed with Cephalosporin use.     Patient Measurements: Height: 6\' 4"  (193 cm) Weight: 191 lb 6.4 oz (86.8 kg) IBW/kg (Calculated) : 86.8 Heparin Dosing Weight: 86.5kg   Vital Signs: Temp: 97.7 F (36.5 C) (02/08 0451) Temp Source: Oral (02/08 0451) BP: 110/77 (02/08 0451) Pulse Rate: 59 (02/08 0451)  Labs:  Recent Labs  05/25/16 0644  05/26/16 0330  05/26/16 2029 05/27/16 0306 05/28/16 0551  HGB 12.4*  --  11.1*  --   --  10.9* 11.5*  HCT 38.4*  --  34.0*  --   --  33.7* 36.3*  PLT 340  --  352  --   --  356 378  HEPARINUNFRC  --   < > 0.23*  < > 0.76* 0.69 0.79*  CREATININE 0.68  --  0.71  --   --   --   --   TROPONINI 0.03*  --   --   --   --   --   --   < > = values in this interval not displayed.  Estimated Creatinine Clearance: 116 mL/min (by C-G formula based on SCr of 0.71 mg/dL).  Assessment: 66 y.o male on heparin for afib. Heparin level supratherapeutic (0.79) on gtt at 2000 units/hr. No bleeding noted. CBC stable.  Goal of Therapy:  Heparin level 0.3-0.7 units/ml Monitor platelets by anticoagulation protocol: Yes   Plan:  Continue heparin gtt at 1850 units/hr Will f/u 6 hr heparin level  Christoper Fabianaron Danner Paulding, PharmD, BCPS Clinical pharmacist, pager [phone] 2/8/20186:44 AM

## 2016-05-28 NOTE — Progress Notes (Signed)
Family Medicine Teaching Service Daily Progress Note Intern Pager: [phone]  Patient name: Jeremiah Carson Medical record number: [id number] Date of birth: [birth year] Age: 66 y.o. Gender: male  Primary Care Provider: Pcp Not In System Consultants: cardiology Code Status: full  Pt Overview and Major Events to Date:  05/24/16- admitted to [facility]- SDU 05/27/16- transferred to telemetry  Assessment and Plan: Jeremiah Carson is a 66 y.o. male presenting with flu-like illness and dyspnea. PMH is significant for h/o RMSF and tobacco use(recently quit).  #Sepsis- secondary to CAP- resolved. qSOFA score on admission of 2 putting patient at high risk. Patient met sepsis criteria on admission with eukocytosis to 17 with left shift. LA initially normal at 1.51. Flu panel negative. Patient with normal CXR, evidence of hyperinflated lungs. UA normal. RVP negative. Patient on Levaquin. Leukocytosis improved. Afebrile. -blood cx no growth x 3 days -continue levaquin and tamiflu, day 5/5 for each  #Dyspnea- improving. No known lung disease. Now with new oxygen requirement. ABG 7.4/50/71/33. Etiology appears to be multifactorial. Differential includes COPD exacerbation vs viral illness vs CAP. Patient with extensive history of smoking. Recently quit 1 week ago cold [place]. CHF ruled out with normal echo. Flu negative. Chest x-ray unremarkable. Echo with normal EF, mild TR, no evidence of heart failure -daily weights -monitor I/O -albuterol nebulizer q6 hours scheduled -continue Incruse  -continue prednisone, on day 3/5 -continue Levaquin for CAP -continue Tamiflu  #A.fib with RVR. New-onset. CHADsVASC score of 0. HR 80's today -continue to monitor on telemetry -cardiology following, appreciate recommendations -continue amiodarone per cardiology  FEN/GI: HH diet Prophylaxis: heparin gtt  Disposition: pending TEE w/ DCCV, possibly home 2/10  Subjective:  Jeremiah Carson is doing well today. He  is agreeable to continue medications after discharge. He is planned for cardioversion tomorrow. Breathing well. Denies chest pain. No fevers or chills.  Objective: Temp:  [96.8 F (36 C)-98.5 F (36.9 C)] 97.5 F (36.4 C) (02/08 0757) Pulse Rate:  [59-83] 59 (02/08 0757) Resp:  [18-22] 18 (02/08 0757) BP: (85-117)/(48-90) 109/72 (02/08 0757) SpO2:  [90 %-95 %] 91 % (02/08 0809) Weight:  [191 lb 6.4 oz (86.8 kg)-194 lb 6.4 oz (88.2 kg)] 191 lb 6.4 oz (86.8 kg) (02/08 0451) Physical Exam: General: elderly man sitting up in bed in NAD Cardiovascular: RRR, no MRG. +2 radial pulses Respiratory: no increased work of breathing. Faint wheezes diffusely, good air movement Abdomen: soft, non-tender, non-distended, +BS Rectal: non-tender, good tone, no blood noted, no masses palpated Extremities: no edema or cyanosis  Laboratory:  Recent Labs Lab 05/26/16 0330 05/27/16 0306 05/28/16 0551  WBC 19.3* 11.6* 10.5  HGB 11.1* 10.9* 11.5*  HCT 34.0* 33.7* 36.3*  PLT 352 356 378    Recent Labs Lab 05/24/16 1429 05/24/16 1808 05/25/16 0644 05/26/16 0330  NA 135  --  133* 131*  K 4.1  --  4.5 4.2  CL 91*  --  96* 90*  CO2 31  --  31 32  BUN 16  --  15 16  CREATININE 0.79 0.78 0.68 0.71  CALCIUM 9.2  --  8.7* 8.6*  PROT 6.9  --   --   --   BILITOT 0.6  --   --   --   ALKPHOS 62  --   --   --   ALT 19  --   --   --   AST 27  --   --   --   GLUCOSE 127*  --  144* 160*    Lactic acid 1.5>2.1>1.2 TSH 0.437 Trop 0.08>0.03  Imaging/Diagnostic Tests: No results found.  TTE: Study Conclusions  - Left ventricle: The cavity size was normal. There was mild   concentric hypertrophy. Systolic function was normal. The   estimated ejection fraction was in the range of 50% to 55%. Wall   motion was normal; there were no regional wall motion   abnormalities. The study was not technically sufficient to allow   evaluation of LV diastolic dysfunction due to atrial flutter. - Aortic valve:  Possibly bicuspid; mildly thickened, mildly   calcified leaflets. - Ascending aorta: The ascending aorta was mildly dilated. - Left atrium: The atrium was normal in size. - Right ventricle: The cavity size was mildly dilated. Wall   thickness was normal. Systolic function was mildly reduced. - Right atrium: The atrium was mildly dilated. - Tricuspid valve: There was mild regurgitation. - Pulmonary arteries: Systolic pressure was within the normal   range. - Inferior vena cava: The vessel was dilated. The respirophasic   diameter changes were blunted (< 50%), consistent with elevated   central venous pressure.  Impressions:  - The study acquired during atrial flutter with RVR.   LVEF low normal.   Aortic valve is most probably bicuspid with no significant   stenosis.   There is mild aneurysmal dilatation of the ascending aorta   measuring 41 mm.   RV appears mildly dilated with mildly decreased systolic   function.   Steve Rattler, DO 05/28/2016, 9:47 AM PGY-1, [place] Intern pager: [phone], text pages welcome

## 2016-05-28 NOTE — Progress Notes (Signed)
Orders received for pt discharge.  Discharge summary printed and reviewed with pt.  Explained medication regimen, and pt had no further questions at this time.  IV removed and site remains clean, dry, intact.  Telemetry removed.  Pt in stable condition and awaiting transport. 

## 2016-05-28 NOTE — Discharge Instructions (Signed)
We recommend you take a daily aspirin for prevention of further heart attacks and strokes.  Please use your inhalers as instructed for breathing troubles. Albuterol is used for shortness of breath. Incruse should be used daily regardless of if you feel short of breath or not. This controls future shortness of breath.  Please take Amiodarone 400 mg daily for the next 2 weeks. You can then decrease the dose to 200 mg daily and continue this for several months until cardiology decides to discontinue this medication. Amiodarone is used to control your hearts rhythm so it does not cause you future breathing troubles.   Atrial Fibrillation Introduction Atrial fibrillation is a type of heartbeat that is irregular or fast (rapid). If you have this condition, your heart keeps quivering in a weird (chaotic) way. This condition can make it so your heart cannot pump blood normally. Having this condition gives a person more risk for stroke, heart failure, and other heart problems. There are different types of atrial fibrillation. Talk with your doctor to learn about the type that you have. Follow these instructions at home:  Take over-the-counter and prescription medicines only as told by your doctor.  If your doctor prescribed a blood-thinning medicine, take it exactly as told. Taking too much of it can cause bleeding. If you do not take enough of it, you will not have the protection that you need against stroke and other problems.  Do not use any tobacco products. These include cigarettes, chewing tobacco, and e-cigarettes. If you need help quitting, ask your doctor.  If you have apnea (obstructive sleep apnea), manage it as told by your doctor.  Do not drink alcohol.  Do not drink beverages that have caffeine. These include coffee, soda, and tea.  Maintain a healthy weight. Do not use diet pills unless your doctor says they are safe for you. Diet pills may make heart problems worse.  Follow diet  instructions as told by your doctor.  Exercise regularly as told by your doctor.  Keep all follow-up visits as told by your doctor. This is important. Contact a doctor if:  You notice a change in the speed, rhythm, or strength of your heartbeat.  You are taking a blood-thinning medicine and you notice more bruising.  You get tired more easily when you move or exercise. Get help right away if:  You have pain in your chest or your belly (abdomen).  You have sweating or weakness.  You feel sick to your stomach (nauseous).  You notice blood in your throw up (vomit), poop (stool), or pee (urine).  You are short of breath.  You suddenly have swollen feet and ankles.  You feel dizzy.  Your suddenly get weak or numb in your face, arms, or legs, especially if it happens on one side of your body.  You have trouble talking, trouble understanding, or both.  Your face or your eyelid droops on one side. These symptoms may be an emergency. Do not wait to see if the symptoms will go away. Get medical help right away. Call your local emergency services (911 in the [place] Do not drive yourself to the hospital.  This information is not intended to replace advice given to you by your health care provider. Make sure you discuss any questions you have with your health care provider. Document Released: 01/14/2008 Document Revised: 09/12/2015 Document Reviewed: 08/01/2014  2017 [employer]

## 2016-05-28 NOTE — Progress Notes (Signed)
Progress Note  Patient Name: Jeremiah Carson Date of Encounter: 05/28/2016  Primary Cardiologist: New- Dr. Elease Hashimoto  Subjective   Pt denies chest pain, dyspnea or lightheadedness. BP is running low but he denies any symptoms.   Inpatient Medications    Scheduled Meds: . albuterol  2.5 mg Nebulization TID  . amiodarone  400 mg Oral BID  . levofloxacin  750 mg Oral q1800  . predniSONE  50 mg Oral Q breakfast  . umeclidinium bromide  1 puff Inhalation Daily   Continuous Infusions: . heparin 1,850 Units/hr (05/28/16 0645)   PRN Meds:    Vital Signs    Vitals:   05/28/16 0200 05/28/16 0451 05/28/16 0757 05/28/16 0809  BP: 117/78 110/77 109/72   Pulse:  (!) 59 (!) 59   Resp:  18 18   Temp:  97.7 F (36.5 C) 97.5 F (36.4 C)   TempSrc:  Oral Oral   SpO2:  92% 93% 91%  Weight:  191 lb 6.4 oz (86.8 kg)    Height:        Intake/Output Summary (Last 24 hours) at 05/28/16 0955 Last data filed at 05/28/16 0929  Gross per 24 hour  Intake             1215 ml  Output             2925 ml  Net            -1710 ml   Filed Weights   05/27/16 0458 05/27/16 1925 05/28/16 0451  Weight: 194 lb 0.1 oz (88 kg) 194 lb 6.4 oz (88.2 kg) 191 lb 6.4 oz (86.8 kg)    Telemetry   Sinus rhythm in the 60's-80's, converted at 1910  - Personally Reviewed  ECG    Atrial flutter at 145 bpm, 2:1 conduction - Personally Reviewed  Physical Exam   GEN: No acute distress. Neck: No JVD Cardiac: Irregularly irregular rhythm, no murmurs, rubs, or gallops.  Respiratory: Inspiratory and expiratory wheezes bilaterally GI: Soft, nontender, non-distended  MS: Trace ankle edema; No deformity. Fingernail and toenail clubbing noted. Neuro:  Nonfocal  Psych: Normal affect   Labs    Chemistry  Recent Labs Lab 05/24/16 1429 05/24/16 1808 05/25/16 0644 05/26/16 0330  NA 135  --  133* 131*  K 4.1  --  4.5 4.2  CL 91*  --  96* 90*  CO2 31  --  31 32  GLUCOSE 127*  --  144* 160*  BUN 16   --  15 16  CREATININE 0.79 0.78 0.68 0.71  CALCIUM 9.2  --  8.7* 8.6*  PROT 6.9  --   --   --   ALBUMIN 3.1*  --   --  2.4*  AST 27  --   --   --   ALT 19  --   --   --   ALKPHOS 62  --   --   --   BILITOT 0.6  --   --   --   GFRNONAA >60 >60 >60 >60  GFRAA >60 >60 >60 >60  ANIONGAP 13  --  6 9     Hematology  Recent Labs Lab 05/26/16 0330 05/27/16 0306 05/28/16 0551  WBC 19.3* 11.6* 10.5  RBC 3.48* 3.49* 3.73*  HGB 11.1* 10.9* 11.5*  HCT 34.0* 33.7* 36.3*  MCV 97.7 96.6 97.3  MCH 31.9 31.2 30.8  MCHC 32.6 32.3 31.7  RDW 12.8 12.7 13.0  PLT 352 356 378  Cardiac Enzymes  Recent Labs Lab 05/24/16 1429 05/24/16 1808 05/24/16 2326 05/25/16 0644  TROPONINI 0.05* 0.07* 0.08* 0.03*   No results for input(s): TROPIPOC in the last 168 hours.   BNP  Recent Labs Lab 05/24/16 1430  BNP 268.3*     DDimer No results for input(s): DDIMER in the last 168 hours.   Radiology    No results found.  Cardiac Studies   Echo 05/25/2016 Study Conclusions  - Left ventricle: The cavity size was normal. There was mild   concentric hypertrophy. Systolic function was normal. The   estimated ejection fraction was in the range of 50% to 55%. Wall   motion was normal; there were no regional wall motion   abnormalities. The study was not technically sufficient to allow   evaluation of LV diastolic dysfunction due to atrial flutter. - Aortic valve: Possibly bicuspid; mildly thickened, mildly   calcified leaflets. - Ascending aorta: The ascending aorta was mildly dilated. - Left atrium: The atrium was normal in size. - Right ventricle: The cavity size was mildly dilated. Wall   thickness was normal. Systolic function was mildly reduced. - Right atrium: The atrium was mildly dilated. - Tricuspid valve: There was mild regurgitation. - Pulmonary arteries: Systolic pressure was within the normal   range. - Inferior vena cava: The vessel was dilated. The respirophasic    diameter changes were blunted (< 50%), consistent with elevated   central venous pressure.  Impressions:  - The study acquired during atrial flutter with RVR.   LVEF low normal.   Aortic valve is most probably bicuspid with no significant   stenosis.   There is mild aneurysmal dilatation of the ascending aorta   measuring 41 mm.   RV appears mildly dilated with mildly decreased systolic   function.  Patient Profile     66 y.o. male with past medical history significant for >50 Pack year smoking history, [place] Spotted fever at age 89 and cervical disc disease who [facility] flulike symptoms. He was found to be in atrial flutter with rapid RVR.   Assessment & Plan    Atrial flutter/fib -Unknown duration -Pt has had flulike symptoms for over 2 weeks, on Sunday felt weak and lightheaded when tried to take a shower. He has had occasional heart fluttering over the last week. Has mild chest tightness worse with coughing.  -Troponins 0.05, 0.07, 0.08, 0.03. Mildly elevated, likely demand ischemia in setting of respiratory illness and atrial flutter tachycardia -BNP 268.3, TSH 0.437 -Echo: EF 50-55%, normal LA, mildly dilated RA. Mild LVH, no regional wall motion abnormalities -CXR: No acute cardiopulmonary abnormality. Pulmonary hyperinflation suspected -Initially given IV cardizem, but became hypotensive. Started on IV  amiodarone for the short term and beta blocker. IV fluids given.  -Converted to sinus rhythm at 1910 last night and maintaining.  -This patient's CHA2DS2-VASc Score is 0 with the little medical history that is available. He has expressed reluctance to take medications, but has now come around. Will not require anticoagulation. Pt agrees to take amiodarone and to follow up with cardiology. -BP into the 80's at night and pt has wheezing. Will discontinue beta blocker and continue amiodarone  Sepsis  -Flulike symptoms with sepsis -  cough, sputum production, weakness, diarrhea, nausea, poor PO intake. Lactic acid peaked at 2.1, down to 1.2  Leukocytosis- WBC 17.7-->19.3-->11.6 -Negative for influenza A and B. Respiratory panel negative. -Likely viral illness and COPD exacerbation in setting  of long time smoker. -Managed by IM with antibiotics, steroids and breathing treatments.  Tobacco use -Greater than 50 pack years -Reports that he quit smoking and drinking tea and coffee 1 week ago. Encouraged continued tobacco cessation. He says that he is done with cigarettes.   Signed, Berton Bon, NP  05/28/2016, 9:55 AM    Attending Note:   The patient was seen and examined.  Agree with assessment and plan as noted above.  Changes made to the above note as needed.  Patient seen and independently examined with Lizabeth Leyden, NP.   We discussed all aspects of the encounter. I agree with the assessment and plan as stated above.  1. Atrial flutter:  Likely related to his underlying infection Has converted with amio. CHADS 2 VASC  Is 0 . He does not want anticoagulation and at this point, I dont think he needs it  Will plan on continuing amio for several months and then DC  OK for patient to be discharged from my [place]  Follow up with [place] in several weeks.     I have spent a total of 40 minutes with patient reviewing hospital  notes , telemetry, EKGs, labs and examining patient as well as establishing an assessment and plan that was discussed with the patient. > 50% of time was spent in direct patient care.    Vesta Mixer, Montez Hageman., MD, [facility] 05/28/2016, 12:42 PM [address]  [address] Office [phone] Pager [phone]

## 2016-05-29 LAB — CULTURE, BLOOD (ROUTINE X 2)
CULTURE: NO GROWTH
Culture: NO GROWTH

## 2016-06-02 ENCOUNTER — Telehealth: Payer: Self-pay | Admitting: *Deleted

## 2016-06-02 NOTE — Telephone Encounter (Signed)
Transitional Care Clinic Post-discharge Follow-Up Phone Call:  Date of Discharge: 05/29/2016 Principal Discharge Diagnosis(es): CAP, Sepsis-Resolved, COPD (New Dx) Post-discharge Communication: Attempt #1 to reach patient and complete post-discharge follow-up phone call. Call placed to #[phone]; unable to reach patient. HIPPA compliant voicemail left requesting return call. Call Completed: No L. Leward Quanucatte, RN, BSN

## 2016-06-08 NOTE — Telephone Encounter (Signed)
Transitional Care Post-Discharge Follow-Up Phone Call:   Admit date: 05/24/2016 Discharge date: 05/29/2016  Discharge Disposition: Home  Best patient contact number: [phone] Emergency contact(s): Lieutenant Diegouzie Cromley (Wife) Same number as patient PCP: None, will be Riccio  Principal Discharge Diagnosis: COPD (new Dx) Sepsis-resolved, CAP  Reason for Chronic Case Management: Co-morbidities as follows:  A-Fib with RVR, No PCP; 1 ED 1 admission in last 6 months  Post-discharge Communication:   Call Completed: Yes, with patient   Interpreter Needed: No   Please check all that apply:  X Patient is caring for self at home.  ? Patient has caregiver. If so, name and best contact number:  X Patient is knowledgeable of his/her condition(s) and/or treatment.  ? Family and/or caregiver is knowledgeable of patient's condition(s) and/or treatment.   Medication Reconciliation:  ? Medication list reviewed with patient.  X Patient has all discharge medications Uses [facility] on [address] Has all discharge meds (is able to afford) Completed prednisone. Taking amiodarone as prescribed, knows to decrease dosage to 200 mg daily on 06/13/16. Taking Incruse Ellipta 1puff daily ? Patient has O2/CPAP ordered? If so, name of company that supplies:  Activities of Daily Living:  X Independent  ? Needs assist (describe)  ? Total Care (describe)   Community resources in place for patient:  X None  ? Home Health If so, name of agency: ? [facility] If so, name of Care Manager and contact number:   ? Assisted Living  ? Hospice  ? Support Group    Topics discussed:  Home Environment: Patient lives with wife in one level home with 2 front steps without handrail. Patient does not feel this is a fall risk for him.  Support System: Wife  Home DME: Ordered at discharge? Does patient have? None  Transportation: Barriers? Patient has own transportation and denies barriers  Food/Nutrition: (ability to afford,  access, use of any community resources) Denies food insecurity  Would patient benefit from consult with Social Work? Behavioral Health? Pharm D? Not at this time  Identified Barriers: None at present  Topics Discussed: Patient states, "My breathing is fantastic." States wife bought him a pulse ox and ranging 92-94%. Sleeps on only one pillow. States last cigarette was AM of hospital admission (05/24/16), however, wife continues to smoke, sometimes in the house due to inclement weather. Discussed having wife smoke outside at all times.  Reminded of TOC appt on 06/11/2016 at 2:30 with Dr.Riccio, and cardiology appt 06/11/16 at 0930 [facility].  Patient denies any questions or concerns at this time.          Kinnie FeilL. Yailyn Strack, RN, BSN

## 2016-06-08 NOTE — Telephone Encounter (Signed)
Transitional Care Clinic Post-discharge Follow-Up Phone Call:  Date of Discharge: 05/29/2016 Principal Discharge Diagnosis(es): CAP, Sepsis-Resolved, COPD (New Dx) Post-discharge Communication: Attempt #2 to reach patient and complete post-discharge follow-up phone call. Call placed to #[phone]; unable to reach patient. HIPPA compliant voicemail left requesting return call. Call Completed: No                     Kinnie FeilL. Millette Halberstam, RN, BSN

## 2016-06-11 ENCOUNTER — Encounter: Payer: Self-pay | Admitting: Family Medicine

## 2016-06-11 ENCOUNTER — Ambulatory Visit (INDEPENDENT_AMBULATORY_CARE_PROVIDER_SITE_OTHER): Payer: 59 | Admitting: Family Medicine

## 2016-06-11 ENCOUNTER — Ambulatory Visit (INDEPENDENT_AMBULATORY_CARE_PROVIDER_SITE_OTHER): Payer: 59 | Admitting: Physician Assistant

## 2016-06-11 ENCOUNTER — Encounter: Payer: Self-pay | Admitting: Physician Assistant

## 2016-06-11 VITALS — BP 115/82 | HR 73 | Ht 76.0 in | Wt 195.4 lb

## 2016-06-11 VITALS — BP 128/88 | HR 73 | Temp 98.1°F | Ht 76.0 in | Wt 197.0 lb

## 2016-06-11 DIAGNOSIS — I480 Paroxysmal atrial fibrillation: Secondary | ICD-10-CM | POA: Diagnosis not present

## 2016-06-11 DIAGNOSIS — J449 Chronic obstructive pulmonary disease, unspecified: Secondary | ICD-10-CM | POA: Insufficient documentation

## 2016-06-11 DIAGNOSIS — I7121 Aneurysm of the ascending aorta, without rupture: Secondary | ICD-10-CM

## 2016-06-11 DIAGNOSIS — I712 Thoracic aortic aneurysm, without rupture: Secondary | ICD-10-CM

## 2016-06-11 DIAGNOSIS — I4891 Unspecified atrial fibrillation: Secondary | ICD-10-CM | POA: Diagnosis not present

## 2016-06-11 DIAGNOSIS — J42 Unspecified chronic bronchitis: Secondary | ICD-10-CM

## 2016-06-11 MED ORDER — AMIODARONE HCL 200 MG PO TABS: 200.0000 mg | ORAL_TABLET | Freq: Every day | ORAL | 2 refills | Status: DC

## 2016-06-11 MED ORDER — ALBUTEROL SULFATE HFA 108 (90 BASE) MCG/ACT IN AERS: 2.0000 | INHALATION_SPRAY | Freq: Four times a day (QID) | RESPIRATORY_TRACT | 3 refills | Status: DC | PRN

## 2016-06-11 NOTE — Patient Instructions (Signed)
Chronic Obstructive Pulmonary Disease Chronic obstructive pulmonary disease (COPD) is a common lung problem. In COPD, the flow of air from the lungs is limited. The way your lungs work will probably never return to normal, but there are things you can do to improve your lungs and make yourself feel better. Your doctor may treat your condition with:  Medicines.  Oxygen.  Lung surgery.  Changes to your diet.  Rehabilitation. This may involve a team of specialists. Follow these instructions at home:  Take all medicines as told by your doctor.  Avoid medicines or cough syrups that dry up your airway (such as antihistamines) and do not allow you to get rid of thick spit. You do not need to avoid them if told differently by your doctor.  If you smoke, stop. Smoking makes the problem worse.  Avoid being around things that make your breathing worse (like smoke, chemicals, and fumes).  Use oxygen therapy and therapy to help improve your lungs (pulmonary rehabilitation) if told by your doctor. If you need home oxygen therapy, ask your doctor if you should buy a tool to measure your oxygen level (oximeter).  Avoid people who have a sickness you can catch (contagious).  Avoid going outside when it is very hot, cold, or humid.  Eat healthy foods. Eat smaller meals more often. Rest before meals.  Stay active, but remember to also rest.  Make sure to get all the shots (vaccines) your doctor recommends. Ask your doctor if you need a pneumonia shot.  Learn and use tips on how to relax.  Learn and use tips on how to control your breathing as told by your doctor. Try: 1. Breathing in (inhaling) through your nose for 1 second. Then, pucker your lips and breath out (exhale) through your lips for 2 seconds. 2. Putting one hand on your belly (abdomen). Breathe in slowly through your nose for 1 second. Your hand on your belly should move out. Pucker your lips and breathe out slowly through your lips.  Your hand on your belly should move in as you breathe out.  Learn and use controlled coughing to clear thick spit from your lungs. The steps are: 1. Lean your head a little forward. 2. Breathe in deeply. 3. Try to hold your breath for 3 seconds. 4. Keep your mouth slightly open while coughing 2 times. 5. Spit any thick spit out into a tissue. 6. Rest and do the steps again 1 or 2 times as needed. Contact a doctor if:  You cough up more thick spit than usual.  There is a change in the color or thickness of the spit.  It is harder to breathe than usual.  Your breathing is faster than usual. Get help right away if:  You have shortness of breath while resting.  You have shortness of breath that stops you from:  Being able to talk.  Doing normal activities.  You chest hurts for longer than 5 minutes.  Your skin color is more blue than usual.  Your pulse oximeter shows that you have low oxygen for longer than 5 minutes. This information is not intended to replace advice given to you by your health care provider. Make sure you discuss any questions you have with your health care provider. Document Released: 09/23/2007 Document Revised: 09/12/2015 Document Reviewed: 12/01/2012 Elsevier Interactive Patient Education  2017 Elsevier Inc.  

## 2016-06-11 NOTE — Patient Instructions (Addendum)
Medication Instructions:  Your physician recommends that you continue on your current medications as directed. Please refer to the Current Medication list given to you today.  Labwork: None ordered  Testing/Procedures: None ordered  Follow-Up: Your physician recommends that you schedule a follow-up appointment in: 2-3 MONTHS WITH DR. NAHSER   Any Other Special Instructions Will Be Listed Below (If Applicable).     If you need a refill on your cardiac medications before your next appointment, please call your pharmacy.   

## 2016-06-11 NOTE — Assessment & Plan Note (Addendum)
  Patient taking incruse daily  -continue incruse -refilled albuterol to use as needed -appt made with Dr Raymondo BandKoval for PFT's -follow up 3 months

## 2016-06-11 NOTE — Assessment & Plan Note (Signed)
  Currently in NSR, HR 73  -refill given for 200 mg amiodarone for 3 months -follow up with cardiology 5/25

## 2016-06-11 NOTE — Progress Notes (Signed)
Cardiology Office Note    Date:  06/11/2016   ID:  [name], [name] [birth year], MRN [id number]  PCP:  Pcp Not In System  Cardiologist:  Dr. Elease Hashimoto  Chief Complaint: Hospital follow up for afib   History of Present Illness:   Jeremiah Carson is a 66 y.o. male with past medical history significant for >50 Pack year smoking history, [place] Spotted fever at age 60 and cervical disc disease who presented to the [facility] ED for evaluation of flulike symptoms with sepsis 05/24/16. He was found to be in atrial flutter with rapid RVR. Initially given IV cardizem, but became hypotensive. IV fluids given. Started on IV  amiodarone for the short term and beta blocker--> converted to sinus rhythm.  CHADS 2 VASC  Is 0 .He does not want anticoagulation and at this point not felt need by Dr. Elease Hashimoto. Felt his afib likely due to viral illness and COPD exacerbation in setting of long time smoker. Plan to continue for several months and then discontinue. TSH 0.437 Echo: EF 50-55%, normal LA, mildly dilated RA. Mild LVH, no regional wall motion abnormalities. Minimally elevated troponin felt demand from elevated rate.   Here today for follow up. Feeling great, never like this in past 2-3 years. He does reminds of intermittent flushing for years that has been resolved. He is recovered from URI. The patient denies nausea, vomiting, fever, chest pain, palpitations, shortness of breath, orthopnea, PND, dizziness, syncope, cough, congestion, abdominal pain, hematochezia, melena, lower extremity edema.  Past Medical History:  Diagnosis Date  . Cervical disc disease   . Rocky [place] spotted fever age 21  . Tobacco use     Past Surgical History:  Procedure Laterality Date  . HERNIA REPAIR Bilateral   . ORCHIECTOMY Left   . TRACHEOSTOMY    . VASECTOMY      Current Medications: Prior to Admission medications   Medication Sig Start Date End Date Taking? Authorizing Provider  amiodarone  (PACERONE) 400 MG tablet Take 1 tablet (400 mg total) by mouth daily. Take 400 mg daily for two weeks, then decrease to 200 mg daily 05/28/16   Tillman Sers, DO  Pseudoephedrine-DM-GG-APAP (ROBITUSSIN COLD/COUGH/FLU PO) Take 5-10 mLs by mouth every 8 (eight) hours as needed (for flu-like or cold symptoms).    Historical Provider, MD  umeclidinium bromide (INCRUSE ELLIPTA) 62.5 MCG/INH AEPB Inhale 1 puff into the lungs daily. 05/29/16   Tillman Sers, DO    Allergies:   Penicillins   Social History   Social History  . Marital status: Married    Spouse name: N/A  . Number of children: N/A  . Years of education: N/A   Social History Main Topics  . Smoking status: Former Smoker    Packs/day: 1.50    Years: 45.00  . Smokeless tobacco: Never Used  . Alcohol use No  . Drug use: No  . Sexual activity: Yes    Partners: Female   Other Topics Concern  . None   Social History Narrative  . None     Family History:  The patient's family history includes Cancer in his mother; Suicidality in his sister; Unexplained death in his brother and father.   ROS:   Please see the history of present illness.    ROS All other systems reviewed and are negative.   PHYSICAL EXAM:   VS:  BP 115/82   Pulse 73   Ht 6\' 4"  (1.93 m)   Wt  195 lb 6.4 oz (88.6 kg)   BMI 23.78 kg/m    GEN: Well nourished, well developed, in no acute distress  HEENT: normal  Neck: no JVD, carotid bruits, or masses Cardiac:RRR; no murmurs, rubs, or gallops,no edema  Respiratory:  clear to auscultation bilaterally, normal work of breathing GI: soft, nontender, nondistended, + BS MS: no deformity or atrophy  Skin: warm and dry, no rash Neuro:  Alert and Oriented x 3, Strength and sensation are intact Psych: euthymic mood, full affect  Wt Readings from Last 3 Encounters:  06/11/16 195 lb 6.4 oz (88.6 kg)  05/28/16 191 lb 6.4 oz (86.8 kg)  09/17/11 197 lb (89.4 kg)      Studies/Labs Reviewed:   EKG:  EKG is  ordered today.  The ekg ordered today demonstrates NSR  Recent Labs: 05/24/2016: ALT 19; B Natriuretic Peptide 268.3; Magnesium 1.7; TSH 0.437 05/26/2016: BUN 16; Creatinine, Ser 0.71; Potassium 4.2; Sodium 131 05/28/2016: Hemoglobin 11.5; Platelets 378   Lipid Panel No results found for: CHOL, TRIG, HDL, CHOLHDL, VLDL, LDLCALC, LDLDIRECT  Additional studies/ records that were reviewed today include:   Echocardiogram: 05/25/16 LV EF: 50% -   55%  ------------------------------------------------------------------- Indications:      Dyspnea 786.09.  ------------------------------------------------------------------- History:   Risk factors:  Current tobacco use.  ------------------------------------------------------------------- Study Conclusions  - Left ventricle: The cavity size was normal. There was mild   concentric hypertrophy. Systolic function was normal. The   estimated ejection fraction was in the range of 50% to 55%. Wall   motion was normal; there were no regional wall motion   abnormalities. The study was not technically sufficient to allow   evaluation of LV diastolic dysfunction due to atrial flutter. - Aortic valve: Possibly bicuspid; mildly thickened, mildly   calcified leaflets. - Ascending aorta: The ascending aorta was mildly dilated. - Left atrium: The atrium was normal in size. - Right ventricle: The cavity size was mildly dilated. Wall   thickness was normal. Systolic function was mildly reduced. - Right atrium: The atrium was mildly dilated. - Tricuspid valve: There was mild regurgitation. - Pulmonary arteries: Systolic pressure was within the normal   range. - Inferior vena cava: The vessel was dilated. The respirophasic   diameter changes were blunted (< 50%), consistent with elevated   central venous pressure.  Impressions:  - The study acquired during atrial flutter with RVR.   LVEF low normal.   Aortic valve is most probably bicuspid with no  significant   stenosis.   There is mild aneurysmal dilatation of the ascending aorta   measuring 41 mm.   RV appears mildly dilated with mildly decreased systolic   function.    ASSESSMENT & PLAN:    1. PAF - Initially given IV cardizem, but became hypotensive. Converted to sinus on IV amiodarone. Plan to continue po amiodarone for several months. Not on anticoagulation as CHADSVASc score is 0. Felt Afib likely due to recent illness. Maintaining sinus rhythm.  TSH normal.  2. Ascending aorta aneurysm  - 41mm cm on echo. Asymptomatic.    Medication Adjustments/Labs and Tests Ordered: Current medicines are reviewed at length with the patient today.  Concerns regarding medicines are outlined above.  Medication changes, Labs and Tests ordered today are listed in the Patient Instructions below. Patient Instructions  Medication Instructions:  Your physician recommends that you continue on your current medications as directed. Please refer to the Current Medication list given to you today.  Labwork: None ordered  Testing/Procedures: None ordered  Follow-Up: Your physician recommends that you schedule a follow-up appointment in: 2-3 MONTHS WITH DR. Elease Hashimoto Any Other Special Instructions Will Be Listed Below (If Applicable).     If you need a refill on your cardiac medications before your next appointment, please call your pharmacy.      Lorelei Pont, [place]  06/11/2016 10:10 AM    [facility] [address] [place], [place], [place]  [address] Phone: [phone]; Fax: [phone]

## 2016-06-11 NOTE — Progress Notes (Signed)
    Subjective:    Patient ID: Jeremiah Carson, male    DOB: 08/03/1950, [age] yL[address] Coaster.o.   MRN: [id number]   CC: hosp fu for a. Fib and COPD   Afib- saw cardiology today, visit went really well. Advised to continue amio for 3 more months. Needs refill on amio. In NSR. No chest pain. Feels a lot better, has more energy.   COPD- still not smoking. Not convinced he has COPD. Feels this was all related to the infection that since it has cleared he is doing much better. He is still using the incruse daily. Has not required albuterol but he has not done a lot of strenuous outdoor work. He plans to start doing this again and will see how things goes. He is open to doing PFT's to confirm diagnosis. No chest pain, cough, SOB. No sputum production.   Smoking status reviewed- former smoker  Review of Systems- see HPI   Objective:  BP 128/88   Pulse 73   Temp 98.1 F (36.7 C) (Oral)   Ht 6\' 4"  (1.93 m)   Wt 197 lb (89.4 kg)   BMI 23.98 kg/m  Vitals and nursing note reviewed  General: well nourished, in no acute distress Cardiac: RRR, clear S1 and S2, no murmurs, rubs, or gallops Respiratory: mild expiratory wheezes L worse than R. No increased work of breathing Abdomen: soft, nontender, nondistended, no masses or organomegaly. Bowel sounds present Extremities: no edema or cyanosis. Skin: warm and dry, no rashes noted Neuro: alert and oriented, no focal deficits   Assessment & Plan:    Atrial fibrillation with RVR (HCC)  Currently in NSR, HR 73  -refill given for 200 mg amiodarone for 3 months -follow up with cardiology 5/25  COPD (chronic obstructive pulmonary disease) (HCC)  Patient taking incruse daily  -continue incruse -refilled albuterol to use as needed -appt made with Dr Raymondo BandKoval for PFT's -follow up 3 months    Return in about 3 months (around 09/08/2016).   Dolores PattyAngela Jasha Hodzic, DO Family Medicine Resident PGY-1

## 2016-06-19 ENCOUNTER — Encounter: Payer: Self-pay | Admitting: Pharmacist

## 2016-06-19 ENCOUNTER — Ambulatory Visit (INDEPENDENT_AMBULATORY_CARE_PROVIDER_SITE_OTHER): Payer: 59 | Admitting: Pharmacist

## 2016-06-19 DIAGNOSIS — J42 Unspecified chronic bronchitis: Secondary | ICD-10-CM | POA: Diagnosis not present

## 2016-06-19 MED ORDER — UMECLIDINIUM-VILANTEROL 62.5-25 MCG/INH IN AEPB: 1.0000 | INHALATION_SPRAY | Freq: Every day | RESPIRATORY_TRACT | 5 refills | Status: DC

## 2016-06-19 NOTE — Patient Instructions (Signed)
Great to see you today.   Lung function test showed severe obstruction of your lungs.   Keep up the great work with quitting smoking.   Use all of your Incruse THEN start Anoro Inhaler.

## 2016-06-19 NOTE — Assessment & Plan Note (Signed)
Spirometry evaluation reveals severe/very severe obstructive lung disease corresponding to GOLD Classification D based on spirometry Stage 4 with 1 exacerbations in the past year and CAT score no evaluated this visit. Patient has been experiencing dyspnea for >1 month and taking Incruse Ellipta since hospital discharge without need for albuterol rescue inhaler. Treatment plan at this time is to finish current Incruse Ellipta inhaler as well as unused inhaler at home, then begin using Anoro Ellipta. Patient reports no issues with current Ellipta inhaler and reports adherence.  Reviewed results of pulmonary function tests.  Pt verbalized understanding of results and education. Patient was advised to engage in brisk walking as possible to continue to improve lung function. Encouraged to continue quitting smoking.  Written pt instructions provided.  F/U Clinic visit 25 May 18. Total time in face to face counseling 45 minutes.

## 2016-06-19 NOTE — Progress Notes (Signed)
   S:    Patient arrives in good spirits and ambulating independently.  Presents for lung function evaluation. Patient is a former up to two pack a day smoker, now reports one cigarette every two to three days after his recent hospitalization. Patient reports his current smoking is largely driven by boredom. He also reports his wife still smokes. 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  Patient was referred on discharge. Patient reports breathing has been improved since discharge.  Patient reports last dose of COPD medications was this morning. His only current medication is Incruse Ellipta. He has not needed to use his rescue inhaler.  Patient was recently discharged from hospital and all medications have been reviewed.  O: mMRC score= >=2  See "scanned report" or Documentation Flowsheet (discrete results - PFTs) for  Spirometry results. Patient provided good effort while attempting spirometry.   Albuterol Neb  Lot# [id number]     Exp. 09/19  A/P:  Spirometry evaluation reveals severe/very severe obstructive lung disease corresponding to GOLD Classification D based on spirometry Stage 4 with 1 exacerbations in the past year and CAT score no evaluated this visit. Patient has been experiencing dyspnea for >1 month and taking Incruse Ellipta since hospital discharge without need for albuterol rescue inhaler. Treatment plan at this time is to finish current Incruse Ellipta inhaler as well as unused inhaler at home, then begin using Anoro Ellipta. Patient reports no issues with current Ellipta inhaler and reports adherence.  Reviewed results of pulmonary function tests.  Pt verbalized understanding of results and education. Patient was advised to engage in brisk walking as possible to continue to improve lung function. Encouraged to continue quitting smoking.  Written pt instructions provided.  F/U Clinic  visit 25 May 18. Total time in face to face counseling 45 minutes.   Patient seen with Elta GuadeloupeJustin Crowder, PharmD Candidate, Coolidge BreezeSarah Mislan, PharmD Candidate.

## 2016-06-20 NOTE — Progress Notes (Signed)
Patient ID: Jeremiah Carson, male   DOB: 05/24/1950, 66 y.o.   MRN: [id number] Reviewed: Agree with Dr. Macky LowerKoval's documentation and management.

## 2016-06-22 ENCOUNTER — Other Ambulatory Visit: Payer: Self-pay | Admitting: Family Medicine

## 2016-06-22 DIAGNOSIS — J42 Unspecified chronic bronchitis: Secondary | ICD-10-CM

## 2016-06-22 MED ORDER — AMIODARONE HCL 200 MG PO TABS: 200.0000 mg | ORAL_TABLET | Freq: Every day | ORAL | 2 refills | Status: DC

## 2016-06-22 MED ORDER — ASPIRIN EC 81 MG PO TBEC: 81.0000 mg | DELAYED_RELEASE_TABLET | Freq: Every day | ORAL | 3 refills | Status: DC

## 2016-06-22 MED ORDER — UMECLIDINIUM-VILANTEROL 62.5-25 MCG/INH IN AEPB: 1.0000 | INHALATION_SPRAY | Freq: Every day | RESPIRATORY_TRACT | 5 refills | Status: DC

## 2016-06-22 NOTE — Telephone Encounter (Signed)
Refilled amiodarone, aspirin, and anoro inhaler through [facility]. Incruse not refilled because changed to Anoro.

## 2016-06-22 NOTE — Telephone Encounter (Signed)
Wife called because she needs all her husbands maintenance medication to go through [facility] for them to be covered. This will make them a lot cheaper. jw

## 2016-06-24 ENCOUNTER — Encounter: Payer: Self-pay | Admitting: Family Medicine

## 2016-06-24 ENCOUNTER — Other Ambulatory Visit: Payer: Self-pay | Admitting: Family Medicine

## 2016-06-26 ENCOUNTER — Other Ambulatory Visit: Payer: Self-pay | Admitting: Family Medicine

## 2016-06-26 DIAGNOSIS — J42 Unspecified chronic bronchitis: Secondary | ICD-10-CM

## 2016-06-26 MED ORDER — AMIODARONE HCL 200 MG PO TABS: 200.0000 mg | ORAL_TABLET | Freq: Every day | ORAL | 2 refills | Status: DC

## 2016-06-26 MED ORDER — UMECLIDINIUM-VILANTEROL 62.5-25 MCG/INH IN AEPB: 1.0000 | INHALATION_SPRAY | Freq: Every day | RESPIRATORY_TRACT | 5 refills | Status: DC

## 2016-06-26 MED ORDER — ASPIRIN EC 81 MG PO TBEC: 81.0000 mg | DELAYED_RELEASE_TABLET | Freq: Every day | ORAL | 3 refills | Status: DC

## 2016-07-06 ENCOUNTER — Encounter: Payer: Self-pay | Admitting: Family Medicine

## 2016-07-10 ENCOUNTER — Other Ambulatory Visit: Payer: Self-pay | Admitting: *Deleted

## 2016-07-10 DIAGNOSIS — J42 Unspecified chronic bronchitis: Secondary | ICD-10-CM

## 2016-07-10 MED ORDER — UMECLIDINIUM-VILANTEROL 62.5-25 MCG/INH IN AEPB: 1.0000 | INHALATION_SPRAY | Freq: Every day | RESPIRATORY_TRACT | 5 refills | Status: DC

## 2016-07-10 MED ORDER — AMIODARONE HCL 200 MG PO TABS: 200.0000 mg | ORAL_TABLET | Freq: Every day | ORAL | 2 refills | Status: DC

## 2016-07-15 MED ORDER — AMIODARONE HCL 200 MG PO TABS: 200.0000 mg | ORAL_TABLET | Freq: Every day | ORAL | 2 refills | Status: DC

## 2016-07-15 MED ORDER — UMECLIDINIUM-VILANTEROL 62.5-25 MCG/INH IN AEPB: 1.0000 | INHALATION_SPRAY | Freq: Every day | RESPIRATORY_TRACT | 5 refills | Status: DC

## 2016-07-15 NOTE — Addendum Note (Signed)
Addended by: Clovis PuMARTIN, Jomarie Gellis L on: 07/15/2016 11:54 AM   Modules accepted: Orders

## 2016-07-15 NOTE — Telephone Encounter (Signed)
Received another refill request for the following medications. They were approved for refills on 07/10/16 but stated phone in.  Medications resent electronically.  Clovis PuMartin, Sabreena Vogan L, RN

## 2016-08-25 ENCOUNTER — Encounter: Payer: Self-pay | Admitting: Cardiovascular Disease

## 2016-09-11 ENCOUNTER — Ambulatory Visit (INDEPENDENT_AMBULATORY_CARE_PROVIDER_SITE_OTHER): Payer: 59 | Admitting: Cardiovascular Disease

## 2016-09-11 ENCOUNTER — Encounter: Payer: Self-pay | Admitting: Cardiovascular Disease

## 2016-09-11 ENCOUNTER — Other Ambulatory Visit: Payer: Self-pay | Admitting: Family Medicine

## 2016-09-11 VITALS — BP 120/80 | HR 81 | Ht 76.0 in | Wt 212.0 lb

## 2016-09-11 DIAGNOSIS — R600 Localized edema: Secondary | ICD-10-CM | POA: Diagnosis not present

## 2016-09-11 DIAGNOSIS — I480 Paroxysmal atrial fibrillation: Secondary | ICD-10-CM | POA: Diagnosis not present

## 2016-09-11 NOTE — Patient Instructions (Addendum)
Medication Instructions:    Your physician has recommended you make the following change in your medication:  1) STOP Amiodarone  - If you need a refill on your cardiac medications before your next appointment, please call your pharmacy.   Labwork:  None ordered  Testing/Procedures: Your physician has requested that you have a lower or upper extremity venous duplex. This test is an ultrasound of the veins in the legs or arms. It looks at venous blood flow that carries blood from the heart to the legs or arms. Allow one hour for a Lower Venous exam. Allow thirty minutes for an Upper Venous exam. There are no restrictions or special instructions.  You are scheduled for 09/15/2016 @ 12:00 pm at our [place].  Follow-Up:  Your physician wants you to follow-up in: 6 months with Dr. Elease HashimotoNahser.  You will receive a reminder letter in the mail two months in advance. If you don't receive a letter, please call our office to schedule the follow-up appointment.  Thank you for choosing [facility]!!   Dory HornSherri Kien Mirsky, RN [phone]  Any Other Special Instructions Will Be Listed Below (If Applicable). Samples of Xarelto given to you.  Do not start this medication unless we instruct you to.   Vascular Ultrasound An ultrasound, also called sonography or ultrasonography, uses harmless sound waves to take pictures of the inside of your body. The pictures are taken with a device called a transducer that is held up against your body. The continually changing pictures can be recorded on videotape or film. A vascular ultrasound is a painless test to see if you have blood flow problems or clots in your blood vessels. It may be done to look at blood vessels almost anywhere in the body. There are several types of ultrasounds that can be done to look at the blood vessels. They include:  Continuous wave Doppler ultrasound. This type of ultrasound uses the change in pitch of sound waves to provide  information about blood flow through a blood vessel. During the test, a health care provider listens to the sounds produced by the transducer.  Duplex ultrasound. This type of ultrasound uses standard ultrasound methods to produce a picture of a blood vessel and surrounding organs. In addition, a computer provides information about the speed and direction of blood flow through the blood vessel. With this type of ultrasound it is possible to see the structures inside the body and to evaluate blood flow within those structures at the same time.  Color Doppler ultrasound. This type of ultrasound uses standard ultrasound methods to produce a picture of a blood vessel. In addition, a computer converts the Doppler sounds into colors that are overlaid on the picture of the blood vessel. These colors represent the speed and direction of blood flow through the vessel.  Power Doppler ultrasound. This type of ultrasound is up to five times more sensitive than color Doppler ultrasound. Power Doppler ultrasound can also get pictures that are difficult or impossible to get using standard color Doppler ultrasound. Power Doppler ultrasound is most commonly used to evaluate blood flow through vessels within organs, such as the liver or kidneys.  Transcranial Doppler ultrasound. This type of ultrasound looks at blood flow in blood vessels throughout the brain. It can reveal the presence of narrow arteries, clots blocking the vessels, or malformed blood vessels. What are the risks? There are no known risks or complications of having an ultrasound. What happens before the procedure?  If the ultrasound scan  involves your upper abdomen, you may be directed not to eat, smoke, or chew gum the morning of your exam. Follow your health care provider's instructions.  During the test, a gel will be applied to your skin. Wear clothing that is easily washable in case the gel gets on your clothes. What happens during the  procedure?  A gel will be applied to your skin. It may feel cool.  The transducer will be placed on the area to be examined.  Pictures will be taken. They will be displayed on one or more monitors that look like small television screens. What happens after the procedure?  You can safely drive home and return to regular activities immediately after your exam.  Keep follow-up visits as directed by your health care provider.  Ask when your test results will be ready. It is your responsibility to get your test results. This information is not intended to replace advice given to you by your health care provider. Make sure you discuss any questions you have with your health care provider. Document Released: 04/17/2004 Document Revised: 09/12/2015 Document Reviewed: 06/29/2013 Elsevier Interactive Patient Education  2017 [employer].

## 2016-09-11 NOTE — Progress Notes (Signed)
Cardiology Office Note   Date:  09/11/2016   ID:  Jeremiah Carson 11/21/1950, MRN [id number]  PCP:  Jeremiah Sers, DO  Cardiologist:   Kristeen Miss, MD   Chief Complaint  Patient presents with  . Atrial Fibrillation    right leg  . Numbness    both hands      History of Present Illness:  Jeremiah Carson is a 66 y.o. male who is being seen today for a follow up  evaluation of atrial fib.    I saw him originally in the hospital. He had Afib and converted with atrial fib.   The Afib was felt to be due to a viral illness Superimposed on COPD. He was also found to have an ascending aortic aneurysm measuring 41 mm by echo.   Feels great, Has not had any further episodes of atrial fibrillation. He is in the process of cutting back on his smoking.  Has had right leg swelling - has been present for several weeks.    Past Medical History:  Diagnosis Date  . Cervical disc disease   . [place] spotted fever age 17  . Tobacco use     Past Surgical History:  Procedure Laterality Date  . HERNIA REPAIR Bilateral   . ORCHIECTOMY Left   . TRACHEOSTOMY    . VASECTOMY       Current Outpatient Prescriptions  Medication Sig Dispense Refill  . albuterol (PROVENTIL HFA;VENTOLIN HFA) 108 (90 Base) MCG/ACT inhaler Inhale 2 puffs into the lungs every 6 (six) hours as needed for wheezing or shortness of breath. 1 Inhaler 3  . amiodarone (PACERONE) 200 MG tablet Take 1 tablet (200 mg total) by mouth daily. 30 tablet 2  . aspirin EC 81 MG tablet Take 1 tablet (81 mg total) by mouth daily. 90 tablet 3  . umeclidinium-vilanterol (ANORO ELLIPTA) 62.5-25 MCG/INH AEPB Inhale 1 puff into the lungs daily. 1 each 5   No current facility-administered medications for this visit.     No flowsheet data found.    Allergies:   Penicillins    Social History:  The patient  reports that he quit smoking about 3 months ago. His smoking use included Cigarettes. He started smoking  about 47 years ago. He has a 46.00 pack-year smoking history. He has never used smokeless tobacco. He reports that he does not drink alcohol or use drugs.   Family History:  The patient's family history includes Cancer in his mother; Suicidality in his sister; Unexplained death in his brother and father.    ROS:  Please see the history of present illness.    Review of Systems: Constitutional:  denies fever, chills, diaphoresis, appetite change and fatigue.  HEENT: denies photophobia, eye pain, redness, hearing loss, ear pain, congestion, sore throat, rhinorrhea, sneezing, neck pain, neck stiffness and tinnitus.  Respiratory: denies SOB, DOE, cough, chest tightness, and wheezing.  Cardiovascular: denies chest pain, palpitations and leg swelling.  Gastrointestinal: denies nausea, vomiting, abdominal pain, diarrhea, constipation, blood in stool.  Genitourinary: denies dysuria, urgency, frequency, hematuria, flank pain and difficulty urinating.  Musculoskeletal: denies  myalgias, back pain, joint swelling, arthralgias and gait problem.   Skin: denies pallor, rash and wound.  Neurological: denies dizziness, seizures, syncope, weakness, light-headedness, numbness and headaches.   Hematological: denies adenopathy, easy bruising, personal or family bleeding history.  Psychiatric/ Behavioral: denies suicidal ideation, mood changes, confusion, nervousness, sleep disturbance and agitation.       All other systems  are reviewed and negative.    PHYSICAL EXAM: VS:  BP 120/80   Pulse 81   Ht 6\' 4"  (1.93 m)   Wt 212 lb (96.2 kg)   SpO2 93%   BMI 25.81 kg/m  , BMI Body mass index is 25.81 kg/m. GEN: Well nourished, well developed, in no acute distress  HEENT: normal  Neck: no JVD, carotid bruits, or masses Cardiac: RRR; no murmurs, rubs, or gallops,no edema  Respiratory:  clear to auscultation bilaterally, normal work of breathing GI: soft, nontender, nondistended, + BS MS: no deformity or  atrophy  Skin: warm and dry, no rash Neuro:  Strength and sensation are intact Psych: normal   EKG:  EKG is not ordered today.     Recent Labs: 05/24/2016: ALT 19; B Natriuretic Peptide 268.3; Magnesium 1.7; TSH 0.437 05/26/2016: BUN 16; Creatinine, Ser 0.71; Potassium 4.2; Sodium 131 05/28/2016: Hemoglobin 11.5; Platelets 378    Lipid Panel No results found for: CHOL, TRIG, HDL, CHOLHDL, VLDL, LDLCALC, LDLDIRECT    Wt Readings from Last 3 Encounters:  09/11/16 212 lb (96.2 kg)  06/19/16 199 lb 9.6 oz (90.5 kg)  06/11/16 197 lb (89.4 kg)      Other studies Reviewed: Additional studies/ records that were reviewed today include: . Review of the above records demonstrates:    ASSESSMENT AND PLAN:  1.  Paroxysmal atrial fib:   Is back in NSR Will DC amio  2. Right calf swelling:  Has significant tense right leg swelling  Will order a venous duplex of his lower legs. Will give a bottle of samples of xarelto 20 mg for him to take if he is found to have a DVT.    Current medicines are reviewed at length with the patient today.  The patient does not have concerns regarding medicines.  Labs/ tests ordered today include:  No orders of the defined types were placed in this encounter.    Disposition:   FU with me in 6 months       Kristeen MissPhilip Maijor Hornig, MD  09/11/2016 3:45 PM    [facility] [address], [place], [place]  [id number] Phone: [phone]) [phone]; Fax: [phone]) [phone]

## 2016-09-15 ENCOUNTER — Ambulatory Visit (HOSPITAL_COMMUNITY)
Admission: RE | Admit: 2016-09-15 | Discharge: 2016-09-15 | Disposition: A | Discharge status: Home / Self Care | Payer: 59 | Source: Ambulatory Visit | Attending: Cardiovascular Disease | Admitting: Cardiovascular Disease

## 2016-09-15 ENCOUNTER — Encounter: Payer: Self-pay | Admitting: Family Medicine

## 2016-09-15 ENCOUNTER — Ambulatory Visit (INDEPENDENT_AMBULATORY_CARE_PROVIDER_SITE_OTHER): Payer: 59 | Admitting: Family Medicine

## 2016-09-15 VITALS — BP 124/76 | HR 71 | Temp 98.0°F | Ht 76.0 in | Wt 213.6 lb

## 2016-09-15 DIAGNOSIS — R2241 Localized swelling, mass and lump, right lower limb: Secondary | ICD-10-CM | POA: Insufficient documentation

## 2016-09-15 DIAGNOSIS — R600 Localized edema: Secondary | ICD-10-CM

## 2016-09-15 DIAGNOSIS — Z720 Tobacco use: Secondary | ICD-10-CM

## 2016-09-15 DIAGNOSIS — I480 Paroxysmal atrial fibrillation: Secondary | ICD-10-CM | POA: Diagnosis not present

## 2016-09-15 DIAGNOSIS — J42 Unspecified chronic bronchitis: Secondary | ICD-10-CM | POA: Diagnosis not present

## 2016-09-15 DIAGNOSIS — J449 Chronic obstructive pulmonary disease, unspecified: Secondary | ICD-10-CM | POA: Diagnosis not present

## 2016-09-15 NOTE — Patient Instructions (Signed)
   It was great seeing you today!  Continue trying to quit smoking.   Let me know if you need any prescription refills or have any health questions. Otherwise, I'll see you in 6 months.  If you have questions or concerns please do not hesitate to call at [phone].  Dolores PattyAngela Riccio, DO PGY-1, [facility] 09/15/2016 11:15 AM

## 2016-09-15 NOTE — Assessment & Plan Note (Signed)
  Currently in normal rhythm. Followed by cards. S/p amiodarone x 4 months  -continue aspirin daily -follow up with cards in 6 months -return precautions given for rapid HR or palpitations

## 2016-09-15 NOTE — Assessment & Plan Note (Addendum)
  Chronic, well controlled  -continue Anora daily -continue albuterol as needed -encourage smoking cessation -follow up 6 months or as needed -return precautions given for fever, SOB, sputum production

## 2016-09-15 NOTE — Assessment & Plan Note (Addendum)
  Chronic. Patient motivated to quit to avoid COPD worsening, getting sick again, and to avoid financial burden.  -patient trying to cut back -continue to encourage cessation -declined medical intervention at this point, prefers to quit on his own

## 2016-09-15 NOTE — Assessment & Plan Note (Signed)
  Concerning for DVT however has improved since onset ~1 week ago. No calf pain, more pre-tibial edema than calf swelling. Of note patient had a normal echocardiogram in February.  -patient scheduled for US today -has supply of xarelto from Cardiologist -follow up on results from [place]

## 2016-09-15 NOTE — Progress Notes (Signed)
    Subjective:    Patient ID: Jeremiah Carson, male    DOB: [birth year], 66 y.o.   MRN: [id number]  CC: follow up afib, COPD  Afib Overall doing well. Was seen by cardiology last week and amiodarone was discontinued. At this appointment he had significant calf swelling and an US was ordered to r/o DVT. He is scheduled to get this done today. He feels the swelling has greatly improved since stopping amiodarone. Denies calf pain, denies chest pain, SOB. He will see cards back in 6 months. He is still taking aspirin.  COPD Doing well, however he is still smoking every other week. He is trying to quit. His wife smokes which makes it difficult. He declines wanting any medication or nicotine replacement treatments to help quit. He is using his anoro inhaler daily. Has not required albuterol. He declines SOB, sputum production, cough. He feels "300 percent" better compared to when he was admitted to hospital.   Smoking status reviewed- current smoker  Review of Systems- see HPI  Objective:  BP 124/76   Pulse 71   Temp 98 F (36.7 C) (Oral)   Ht 6\' 4"  (1.93 m)   Wt 213 lb 9.6 oz (96.9 kg)   SpO2 93%   BMI 26.00 kg/m  Vitals and nursing note reviewed  General: well nourished, in no acute distress Cardiac: regular rate regular rhythm, no MRG. +2 radial pulses bilaterally. Respiratory: faint expiratory wheezes auscultated bilaterally, no increased work of breathing Extremities: +1 edema up to mid-shin on right leg, no edema noted on left leg. No calf pain, negative homans sign. Neuro: alert and oriented, no focal deficits  Assessment & Plan:    Edema of right lower extremity  Concerning for DVT however has improved since onset ~1 week ago. No calf pain, more pre-tibial edema than calf swelling. Of note patient had a normal echocardiogram in February.  -patient scheduled for US today -has supply of xarelto from Cardiologist -follow up on results from US   Tobacco abuse  Chronic.  Patient motivated to quit to avoid COPD worsening, getting sick again, and to avoid financial burden.  -patient trying to cut back -continue to encourage cessation -declined medical intervention at this point, prefers to quit on his own  COPD (chronic obstructive pulmonary disease) (HCC)  Chronic, well controlled  -continue Anora daily -continue albuterol as needed -encourage smoking cessation -follow up 6 months or as needed -return precautions given for fever, SOB, sputum production  PAF (paroxysmal atrial fibrillation) (HCC)  Currently in normal rhythm. Followed by cards. S/p amiodarone x 4 months  -continue aspirin daily -follow up with cards in 6 months -return precautions given for rapid HR or palpitations    Return in about 6 months (around 03/18/2017).   Dolores PattyAngela Riccio, DO Family Medicine Resident PGY-1

## 2016-12-05 ENCOUNTER — Other Ambulatory Visit: Payer: Self-pay | Admitting: Family Medicine

## 2016-12-05 DIAGNOSIS — J42 Unspecified chronic bronchitis: Secondary | ICD-10-CM

## 2016-12-09 NOTE — Telephone Encounter (Signed)
2nd request.  Tranise Forrest L, RN  

## 2017-02-17 ENCOUNTER — Telehealth: Payer: Self-pay | Admitting: Family Medicine

## 2017-02-17 NOTE — Telephone Encounter (Signed)
Pt says he only has about 6 doses of his anoro left. [facility] will not send out his next shipment until nov 15. He doesn't know why he is running out so early. He would like to have a Rx [facility] to [facility] [address].

## 2017-02-18 ENCOUNTER — Other Ambulatory Visit: Payer: Self-pay | Admitting: Family Medicine

## 2017-02-18 DIAGNOSIS — J42 Unspecified chronic bronchitis: Secondary | ICD-10-CM

## 2017-02-18 MED ORDER — UMECLIDINIUM-VILANTEROL 62.5-25 MCG/INH IN AEPB: 1.0000 | INHALATION_SPRAY | Freq: Every day | RESPIRATORY_TRACT | 0 refills | Status: DC

## 2017-04-30 ENCOUNTER — Encounter: Payer: Self-pay | Admitting: Cardiovascular Disease

## 2017-04-30 ENCOUNTER — Ambulatory Visit (INDEPENDENT_AMBULATORY_CARE_PROVIDER_SITE_OTHER): Payer: 59 | Admitting: Cardiovascular Disease

## 2017-04-30 VITALS — BP 118/82 | HR 94 | Ht 77.0 in | Wt 217.0 lb

## 2017-04-30 DIAGNOSIS — I480 Paroxysmal atrial fibrillation: Secondary | ICD-10-CM | POA: Diagnosis not present

## 2017-04-30 DIAGNOSIS — I77810 Thoracic aortic ectasia: Secondary | ICD-10-CM

## 2017-04-30 NOTE — Patient Instructions (Signed)
Medication Instructions:  Your physician recommends that you continue on your current medications as directed. Please refer to the Current Medication list given to you today.   Labwork: None Ordered   Testing/Procedures: None Ordered   Follow-Up: Your physician wants you to follow-up in: 1 year with Tereso NewcomerScott Weaver, PA.  You will receive a reminder letter in the mail two months in advance. If you don't receive a letter, please call our office to schedule the follow-up appointment.   If you need a refill on your cardiac medications before your next appointment, please call your pharmacy.   Thank you for choosing [facility]! Eligha BridegroomMichelle Swinyer, RN [phone]

## 2017-04-30 NOTE — Progress Notes (Signed)
Cardiology Office Note   Date:  04/30/2017   ID:  Eban, Weick 07-30-1950, MRN [id number]  PCP:  Tillman Sers, DO  Cardiologist:   Kristeen Miss, MD   No chief complaint on file.     History of Present Illness:  Jeremiah Carson is a 67 y.o. male who is being seen today for a follow up  evaluation of atrial fib.    I saw him originally in the hospital. He had Afib and converted with atrial fib.   The Afib was felt to be due to a viral illness Superimposed on COPD. He was also found to have an ascending aortic aneurysm measuring 41 mm by echo.   Feels great, Has not had any further episodes of atrial fibrillation. He is in the process of cutting back on his smoking.  Has had right leg swelling - has been present for several weeks.   April 30, 2017: Jeremiah Carson is doing well.  He was having some leg swelling the last time I saw him.  We were concerned for DVT.  Lower extremity venous duplex scan did not reveal any evidence of DVT.  Is feeling well.  He does some yard work. No further episodes of PAF  He is been trying to stop smoking.   Past Medical History:  Diagnosis Date  . Cervical disc disease   . [place] spotted fever age 59  . Tobacco use     Past Surgical History:  Procedure Laterality Date  . HERNIA REPAIR Bilateral   . ORCHIECTOMY Left   . TRACHEOSTOMY    . VASECTOMY       Current Outpatient Medications  Medication Sig Dispense Refill  . albuterol (PROVENTIL HFA;VENTOLIN HFA) 108 (90 Base) MCG/ACT inhaler Inhale 2 puffs into the lungs every 6 (six) hours as needed for wheezing or shortness of breath. 1 Inhaler 3  . aspirin EC 81 MG tablet Take 1 tablet (81 mg total) by mouth daily. 90 tablet 3  . umeclidinium-vilanterol (ANORO ELLIPTA) 62.5-25 MCG/INH AEPB Inhale 1 puff into the lungs daily. 60 each 0   No current facility-administered medications for this visit.     No flowsheet data found.    Allergies:   Penicillins     Social History:  The patient  reports that he quit smoking about 11 months ago. His smoking use included cigarettes. He started smoking about 48 years ago. He has a 46.00 pack-year smoking history. he has never used smokeless tobacco. He reports that he does not drink alcohol or use drugs.   Family History:  The patient's family history includes Cancer in his mother; Suicidality in his sister; Unexplained death in his brother and father.    ROS: As noted in current history.  Otherwise negative.  Physical Exam: Blood pressure 118/82, pulse 94, height 6\' 5"  (1.956 m), weight 217 lb (98.4 kg), SpO2 94 %.  GEN:  Well nourished, well developed in no acute distress HEENT: Normal NECK: No JVD; No carotid bruits LYMPHATICS: No lymphadenopathy CARDIAC: RR RESPIRATORY:  Clear to auscultation without rales, wheezing or rhonchi  ABDOMEN: Soft, non-tender, non-distended MUSCULOSKELETAL:  No edema; No deformity  SKIN: Warm and dry NEUROLOGIC:  Alert and oriented x 3    EKG:      April 30, 2017: Normal sinus rhythm at 94.  No ST or T wave changes.   Recent Labs: 05/24/2016: ALT 19; B Natriuretic Peptide 268.3; Magnesium 1.7; TSH 0.437 05/26/2016: BUN 16; Creatinine, Ser 0.71;  Potassium 4.2; Sodium 131 05/28/2016: Hemoglobin 11.5; Platelets 378    Lipid Panel No results found for: CHOL, TRIG, HDL, CHOLHDL, VLDL, LDLCALC, LDLDIRECT    Wt Readings from Last 3 Encounters:  04/30/17 217 lb (98.4 kg)  09/15/16 213 lb 9.6 oz (96.9 kg)  09/11/16 212 lb (96.2 kg)      Other studies Reviewed: Additional studies/ records that were reviewed today include: . Review of the above records demonstrates:    ASSESSMENT AND PLAN:  1.  Paroxysmal atrial fib:   Is back in NSR He has not had any recurrent episodes of atrial fibrillation.   2.  Mild ascending  aortic dilatation: Noted.  No specific follow-up at this time.  We will consider getting a repeat CT scan at some point.   Current  medicines are reviewed at length with the patient today.  The patient does not have concerns regarding medicines.  Labs/ tests ordered today include:  No orders of the defined types were placed in this encounter.    Disposition:   To see Tereso NewcomerScott Weaver, PA in 1 year.       Kristeen MissPhilip Cacie Gaskins, MD  04/30/2017 4:08 PM    [facility] [address], [place], [place]  [address] Phone: [phone]) [phone]; Fax: [phone]

## 2017-07-25 IMAGING — DX DG CHEST 1V PORT
1 series · 2 of 2 positions shown · non-contrast
Comparison: None

CLINICAL DATA: 65-year-old male with shortness of breath and cough
for 2 weeks. Delirium yesterday. Initial encounter.

EXAM:
PORTABLE CHEST 1 VIEW

[Series 1: chest ap · 0.14mm/px · 2 of 2 slices shown]
[im 1/2]
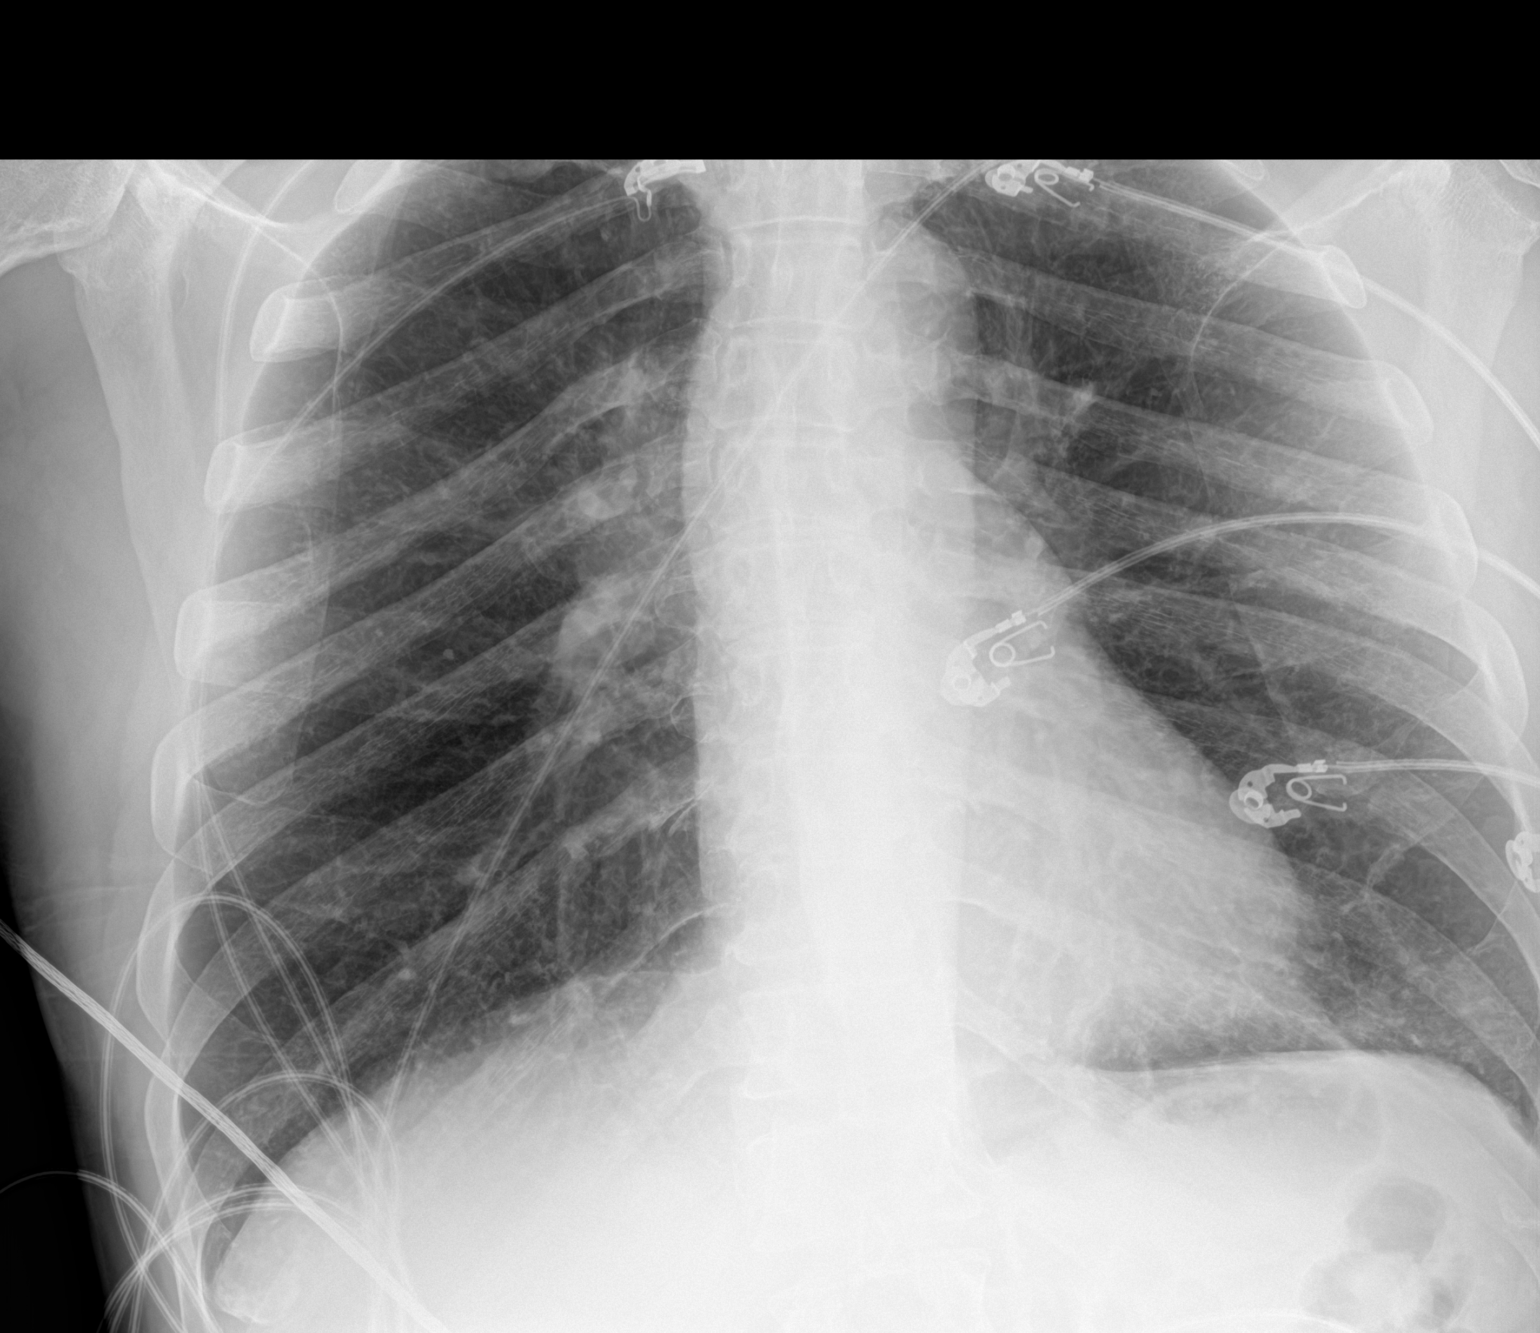
[im 2/2]
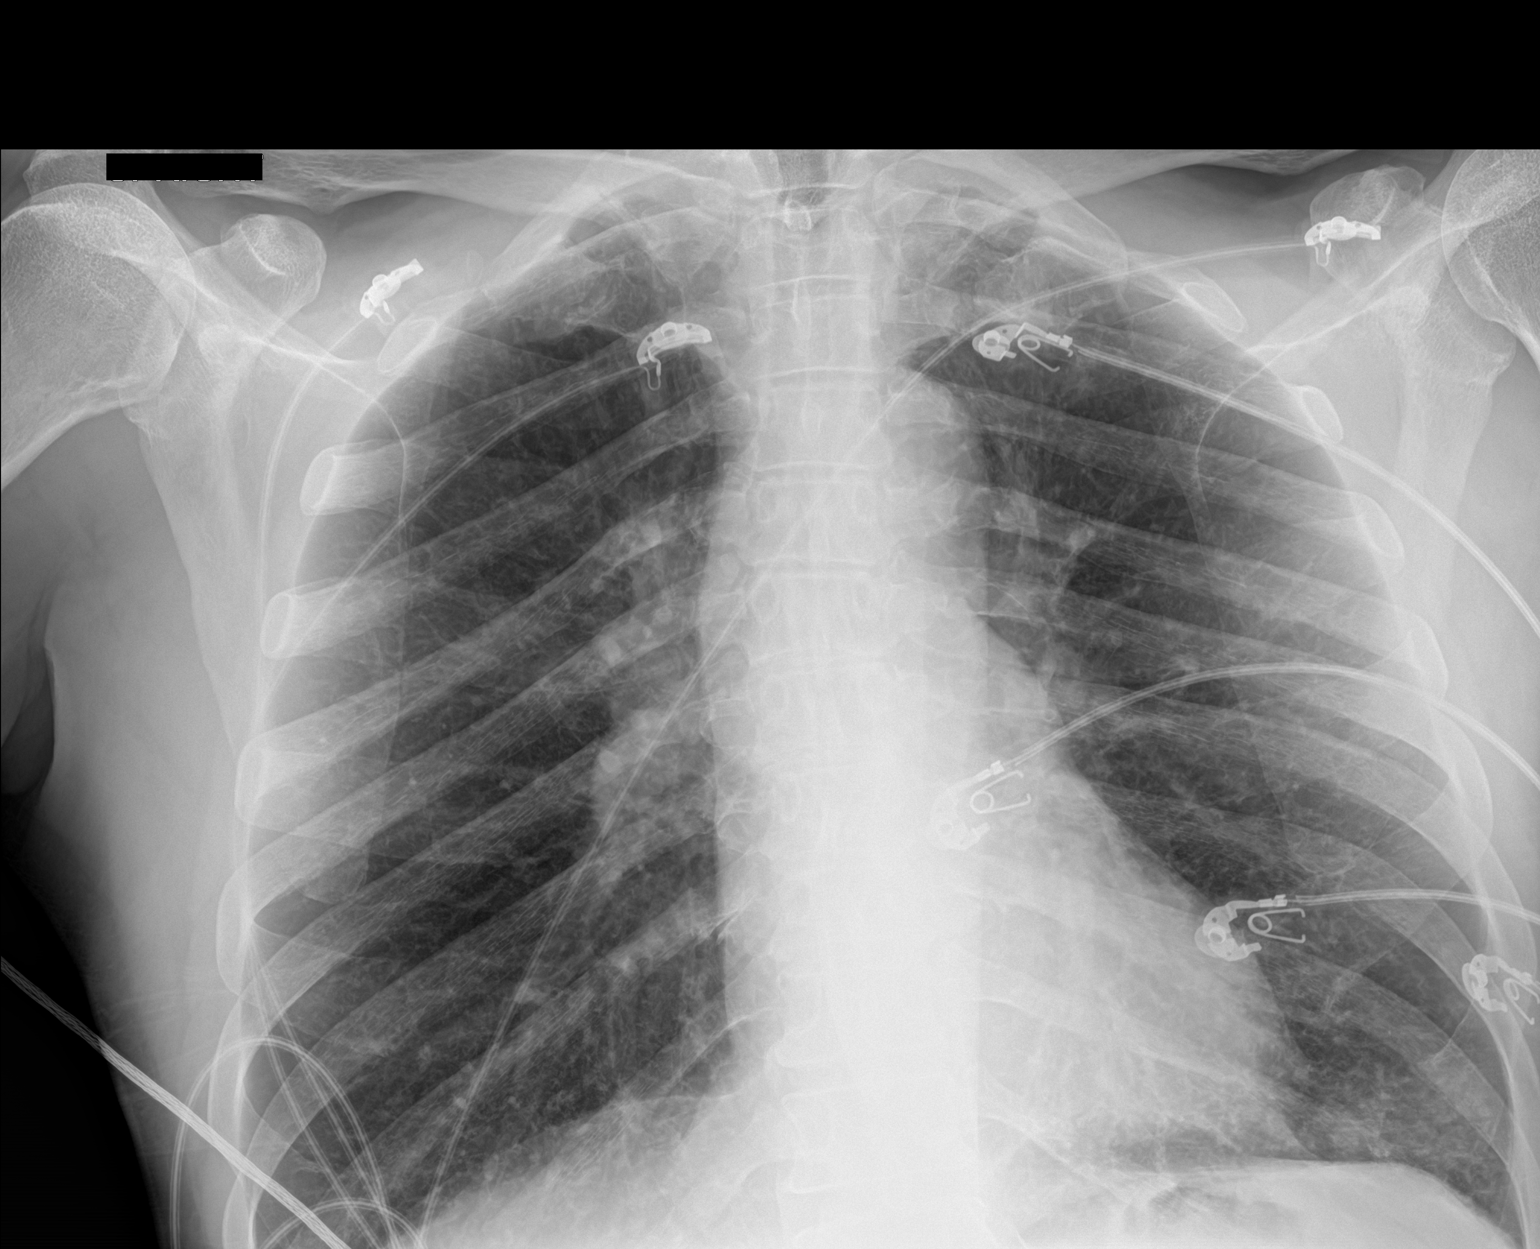

[2 of 2 positions shown; findings below may reference images not displayed]

FINDINGS: Portable AP upright view at 7796 hours. Pulmonary hyperinflation
suspected. Allowing for portable technique the lungs are clear. No
pneumothorax or pleural effusion. Normal cardiac size and
mediastinal contours. Visualized tracheal air column is within
normal limits. Negative visible bowel gas pattern.
IMPRESSION: No acute cardiopulmonary abnormality. Pulmonary hyperinflation
suspected.

## 2017-11-11 ENCOUNTER — Encounter: Payer: Self-pay | Admitting: Family Medicine

## 2017-12-15 ENCOUNTER — Ambulatory Visit (INDEPENDENT_AMBULATORY_CARE_PROVIDER_SITE_OTHER): Payer: 59 | Admitting: Family Medicine

## 2017-12-15 ENCOUNTER — Other Ambulatory Visit: Payer: Self-pay

## 2017-12-15 ENCOUNTER — Encounter: Payer: Self-pay | Admitting: Family Medicine

## 2017-12-15 VITALS — BP 110/62 | HR 91 | Temp 98.4°F | Wt 211.0 lb

## 2017-12-15 DIAGNOSIS — Z720 Tobacco use: Secondary | ICD-10-CM | POA: Diagnosis not present

## 2017-12-15 DIAGNOSIS — Z23 Encounter for immunization: Secondary | ICD-10-CM | POA: Diagnosis not present

## 2017-12-15 DIAGNOSIS — J42 Unspecified chronic bronchitis: Secondary | ICD-10-CM | POA: Diagnosis not present

## 2017-12-15 MED ORDER — UMECLIDINIUM-VILANTEROL 62.5-25 MCG/INH IN AEPB: 1.0000 | INHALATION_SPRAY | Freq: Every day | RESPIRATORY_TRACT | 5 refills | Status: DC

## 2017-12-15 NOTE — Progress Notes (Signed)
    Subjective:    Patient ID: Lorenz CoasterBilly J Tucci, male    DOB: 07-12-1950, 67 y.o.   MRN: [id number]   CC: follow up COPD  [name] reports he has been doing well. He's been very active outside. He uses his anoro inhaler with good control of symptoms. He does report if he forgets to use his inhaler at night he will have difficulty in the morning. He rarely uses his albuterol inhaler. He is still smoking. He reports he has tried to quit many times since I last saw him, about 6-7 times. His longest successful time without smoking was about 1 month.   He denies feeling SOB, having cough, increased sputum production, wheezing. He denies CP, DOE. He denies feeling palpitations or his hear racing or skipping beats. He is due to follow up with cardiology in January.   Smoking status reviewed- current smoker  Review of Systems- denies palpitations, SOB, chest pain   Objective:  BP 110/62   Pulse 91   Temp 98.4 F (36.9 C) (Oral)   Wt 211 lb (95.7 kg)   SpO2 92%   BMI 25.02 kg/m  Vitals and nursing note reviewed  General: well nourished, in no acute distress HEENT: normocephalic, MMM Neck: supple, non-tender, without lymphadenopathy Cardiac: RRR, clear S1 and S2, no murmurs, rubs, or gallops Respiratory: clear to auscultation bilaterally, no increased work of breathing Extremities: no edema or cyanosis Skin: warm and dry, no rashes noted Neuro: alert and oriented, no focal deficits  Assessment & Plan:    COPD (chronic obstructive pulmonary disease) (HCC)  Well controlled on anoro inhaler. Discussed smoking cessation at length. Patient currently does not need refill on inhaler, asked him to contact me if he does.   Tobacco abuse  Patient again declines medical intervention. Discussed utilizing the quit line. Will continue to encourage cessation at future visits.  Flu shot given today.  Return in about 6 months (around 06/17/2018).   Dolores PattyAngela Arjay Jaskiewicz, DO Family Medicine  Resident PGY-3

## 2017-12-15 NOTE — Patient Instructions (Addendum)
Great seeing you today! Be careful on your motorcycle.  Follow up with your cardiologist in January. Do your best to quit smoking! Let me know if you change your mind about the pneumonia vaccine.  If you have questions or concerns please do not hesitate to call at [phone].  Dolores Patty, DO PGY-3, [facility] 12/15/2017 2:55 PM   Steps to Quit Smoking Smoking tobacco can be harmful to your health and can affect almost every organ in your body. Smoking puts you, and those around you, at risk for developing many serious chronic diseases. Quitting smoking is difficult, but it is one of the best things that you can do for your health. It is never too late to quit. What are the benefits of quitting smoking? When you quit smoking, you lower your risk of developing serious diseases and conditions, such as:  Lung cancer or lung disease, such as COPD.  Heart disease.  Stroke.  Heart attack.  Infertility.  Osteoporosis and bone fractures.  Additionally, symptoms such as coughing, wheezing, and shortness of breath may get better when you quit. You may also find that you get sick less often because your body is stronger at fighting off colds and infections. If you are pregnant, quitting smoking can help to reduce your chances of having a baby of low birth weight. How do I get ready to quit? When you decide to quit smoking, create a plan to make sure that you are successful. Before you quit:  Pick a date to quit. Set a date within the next two weeks to give you time to prepare.  Write down the reasons why you are quitting. Keep this list in places where you will see it often, such as on your bathroom mirror or in your car or wallet.  Identify the people, places, things, and activities that make you want to smoke (triggers) and avoid them. Make sure to take these actions: ? Throw away all cigarettes at home, at work, and in your car. ? Throw away smoking accessories,  such as Set designer. ? Clean your car and make sure to empty the ashtray. ? Clean your home, including curtains and carpets.  Tell your family, friends, and coworkers that you are quitting. Support from your loved ones can make quitting easier.  Talk with your health care provider about your options for quitting smoking.  Find out what treatment options are covered by your health insurance.  What strategies can I use to quit smoking? Talk with your healthcare provider about different strategies to quit smoking. Some strategies include:  Quitting smoking altogether instead of gradually lessening how much you smoke over a period of time. Research shows that quitting "cold [place]" is more successful than gradually quitting.  Attending in-person counseling to help you build problem-solving skills. You are more likely to have success in quitting if you attend several counseling sessions. Even short sessions of 10 minutes can be effective.  Finding resources and support systems that can help you to quit smoking and remain smoke-free after you quit. These resources are most helpful when you use them often. They can include: ? Online chats with a Veterinary surgeon. ? Telephone quitlines. ? Automotive engineer. ? Support groups or group counseling. ? Text messaging programs. ? Mobile phone applications.  Taking medicines to help you quit smoking. (If you are pregnant or breastfeeding, talk with your health care provider first.) Some medicines contain nicotine and some do not. Both types of medicines help  with cravings, but the medicines that include nicotine help to relieve withdrawal symptoms. Your health care provider may recommend: ? Nicotine patches, gum, or lozenges. ? Nicotine inhalers or sprays. ? Non-nicotine medicine that is taken by mouth.  Talk with your health care provider about combining strategies, such as taking medicines while you are also receiving in-person  counseling. Using these two strategies together makes you more likely to succeed in quitting than if you used either strategy on its own. If you are pregnant or breastfeeding, talk with your health care provider about finding counseling or other support strategies to quit smoking. Do not take medicine to help you quit smoking unless told to do so by your health care provider. What things can I do to make it easier to quit? Quitting smoking might feel overwhelming at first, but there is a lot that you can do to make it easier. Take these important actions:  Reach out to your family and friends and ask that they support and encourage you during this time. Call telephone quitlines, reach out to support groups, or work with a counselor for support.  Ask people who smoke to avoid smoking around you.  Avoid places that trigger you to smoke, such as bars, parties, or smoke-break areas at work.  Spend time around people who do not smoke.  Lessen stress in your life, because stress can be a smoking trigger for some people. To lessen stress, try: ? Exercising regularly. ? Deep-breathing exercises. ? Yoga. ? Meditating. ? Performing a body scan. This involves closing your eyes, scanning your body from head to toe, and noticing which parts of your body are particularly tense. Purposefully relax the muscles in those areas.  Download or purchase mobile phone or tablet apps (applications) that can help you stick to your quit plan by providing reminders, tips, and encouragement. There are many free apps, such as QuitGuide from the [employer] developer(Centers for Disease Control and Prevention). You can find other support for quitting smoking (smoking cessation) through smokefree.gov and other websites.  How will I feel when I quit smoking? Within the first 24 hours of quitting smoking, you may start to feel some withdrawal symptoms. These symptoms are usually most noticeable 2-3 days after quitting, but they usually do not  last beyond 2-3 weeks. Changes or symptoms that you might experience include:  Mood swings.  Restlessness, anxiety, or irritation.  Difficulty concentrating.  Dizziness.  Strong cravings for sugary foods in addition to nicotine.  Mild weight gain.  Constipation.  Nausea.  Coughing or a sore throat.  Changes in how your medicines work in your body.  A depressed mood.  Difficulty sleeping (insomnia).  After the first 2-3 weeks of quitting, you may start to notice more positive results, such as:  Improved sense of smell and taste.  Decreased coughing and sore throat.  Slower heart rate.  Lower blood pressure.  Clearer skin.  The ability to breathe more easily.  Fewer sick days.  Quitting smoking is very challenging for most people. Do not get discouraged if you are not successful the first time. Some people need to make many attempts to quit before they achieve long-term success. Do your best to stick to your quit plan, and talk with your health care provider if you have any questions or concerns. This information is not intended to replace advice given to you by your health care provider. Make sure you discuss any questions you have with your health care provider. Document Released: 03/31/2001  Document Revised: 12/03/2015 Document Reviewed: 08/21/2014 Elsevier Interactive Patient Education  [employer].

## 2017-12-17 NOTE — Assessment & Plan Note (Signed)
  Patient again declines medical intervention. Discussed utilizing the quit line. Will continue to encourage cessation at future visits.

## 2017-12-17 NOTE — Assessment & Plan Note (Signed)
  Well controlled on anoro inhaler. Discussed smoking cessation at length. Patient currently does not need refill on inhaler, asked him to contact me if he does.

## 2018-06-09 ENCOUNTER — Other Ambulatory Visit: Payer: Self-pay | Admitting: Family Medicine

## 2018-06-09 DIAGNOSIS — J42 Unspecified chronic bronchitis: Secondary | ICD-10-CM

## 2018-12-07 ENCOUNTER — Telehealth (INDEPENDENT_AMBULATORY_CARE_PROVIDER_SITE_OTHER): Payer: 59 | Admitting: Physician Assistant

## 2018-12-07 ENCOUNTER — Encounter: Payer: Self-pay | Admitting: Physician Assistant

## 2018-12-07 ENCOUNTER — Other Ambulatory Visit: Payer: Self-pay

## 2018-12-07 ENCOUNTER — Telehealth: Payer: Self-pay | Admitting: *Deleted

## 2018-12-07 VITALS — Ht 77.0 in | Wt 210.0 lb

## 2018-12-07 DIAGNOSIS — I480 Paroxysmal atrial fibrillation: Secondary | ICD-10-CM

## 2018-12-07 DIAGNOSIS — Z7189 Other specified counseling: Secondary | ICD-10-CM

## 2018-12-07 DIAGNOSIS — I712 Thoracic aortic aneurysm, without rupture, unspecified: Secondary | ICD-10-CM

## 2018-12-07 DIAGNOSIS — Z720 Tobacco use: Secondary | ICD-10-CM

## 2018-12-07 NOTE — Telephone Encounter (Signed)
Virtual Visit Pre-Appointment Phone Call  "(Name), I am calling you today to discuss your upcoming appointment. We are currently trying to limit exposure to the virus that causes COVID-19 by seeing patients at home rather than in the office."  1. "What is the BEST phone number to call the day of the visit?" - include this in appointment notes  2. "Do you have or have access to (through a family member/friend) a smartphone with video capability that we can use for your visit?" a. If yes - list this number in appt notes as "cell" (if different from BEST phone #) and list the appointment type as a VIDEO visit in appointment notes b. If no - list the appointment type as a PHONE visit in appointment notes  3. Confirm consent - "In the setting of the current Covid19 crisis, you are scheduled for a (phone or video) visit with your provider on (date) at (time).  Just as we do with many in-office visits, in order for you to participate in this visit, we must obtain consent.  If you'd like, I can send this to your mychart (if signed up) or email for you to review.  Otherwise, I can obtain your verbal consent now.  All virtual visits are billed to your insurance company just like a normal visit would be.  By agreeing to a virtual visit, we'd like you to understand that the technology does not allow for your provider to perform an examination, and thus may limit your provider's ability to fully assess your condition. If your provider identifies any concerns that need to be evaluated in person, we will make arrangements to do so.  Finally, though the technology is pretty good, we cannot assure that it will always work on either your or our end, and in the setting of a video visit, we may have to convert it to a phone-only visit.  In either situation, we cannot ensure that we have a secure connection.  Are you willing to proceed?" STAFF: Did the patient verbally acknowledge consent to telehealth visit? Document  YES/NO here: yes  4. Advise patient to be prepared - "Two hours prior to your appointment, go ahead and check your blood pressure, pulse, oxygen saturation, and your weight (if you have the equipment to check those) and write them all down. When your visit starts, your provider will ask you for this information. If you have an Apple Watch or Kardia device, please plan to have heart rate information ready on the day of your appointment. Please have a pen and paper handy nearby the day of the visit as well."  5. Give patient instructions for MyChart download to smartphone OR [email or web address] as below if video visit (depending on what platform provider is using)  6. Inform patient they will receive a phone call 15 minutes prior to their appointment time (may be from unknown caller ID) so they should be prepared to answer    TELEPHONE CALL NOTE  DHAIRYA CORALES has been deemed a candidate for a follow-up tele-health visit to limit community exposure during the Covid-19 pandemic. I spoke with the patient via phone to ensure availability of phone/video source, confirm preferred email & phone number, and discuss instructions and expectations.  I reminded Avyukt Cimo Boylen to be prepared with any vital sign and/or heart rhythm information that could potentially be obtained via home monitoring, at the time of his visit. I reminded NATANEL SNAVELY to expect a phone call prior to  his visit.  Janecia Palau 12/07/2018 3:37 PM   INSTRUCTIONS FOR DOWNLOADING THE MYCHART APP TO SMARTPHONE  - The patient must first make sure to have activated MyChart and know their login information - If Apple, go to [employer] Store and type in MyChart in the search bar and download the app. If Android, ask patient to go to [employer] Play Store and type in [facility] in the search bar and download the app. The app is free but as with any other app downloads, their phone may require them to verify saved payment information or  Apple/Android password.  - The patient will need to then log into the app with their MyChart username and password, and select [facility] as their healthcare provider to link the account. When it is time for your visit, go to the MyChart app, find appointments, and click Begin Video Visit. Be sure to Select Allow for your device to access the Microphone and Camera for your visit. You will then be connected, and your provider will be with you shortly.  **If they have any issues connecting, or need assistance please contact MyChart service desk [phone])**  **If using a computer, in order to ensure the best quality for their visit they will need to use either of the following Internet Browsers: [employer] Edge, or Google Chrome**  IF USING DOXIMITY or DOXY.ME - The patient will receive a link just prior to their visit by text.     FULL LENGTH CONSENT FOR TELE-HEALTH VISIT   I hereby voluntarily request, consent and authorize [facility] and its employed or contracted physicians, physician assistants, nurse practitioners or other licensed health care professionals (the Practitioner), to provide me with telemedicine health care services (the "Services") as deemed necessary by the treating Practitioner. I acknowledge and consent to receive the Services by the Practitioner via telemedicine. I understand that the telemedicine visit will involve communicating with the Practitioner through live audiovisual communication technology and the disclosure of certain medical information by electronic transmission. I acknowledge that I have been given the opportunity to request an in-person assessment or other available alternative prior to the telemedicine visit and am voluntarily participating in the telemedicine visit.  I understand that I have the right to withhold or withdraw my consent to the use of telemedicine in the course of my care at any time, without affecting my right to future care  or treatment, and that the Practitioner or I may terminate the telemedicine visit at any time. I understand that I have the right to inspect all information obtained and/or recorded in the course of the telemedicine visit and may receive copies of available information for a reasonable fee.  I understand that some of the potential risks of receiving the Services via telemedicine include:  Marland Kitchen. Delay or interruption in medical evaluation due to technological equipment failure or disruption; . Information transmitted may not be sufficient (e.g. poor resolution of images) to allow for appropriate medical decision making by the Practitioner; and/or  . In rare instances, security protocols could fail, causing a breach of personal health information.  Furthermore, I acknowledge that it is my responsibility to provide information about my medical history, conditions and care that is complete and accurate to the best of my ability. I acknowledge that Practitioner's advice, recommendations, and/or decision may be based on factors not within their control, such as incomplete or inaccurate data provided by me or distortions of diagnostic images or specimens that may result from electronic transmissions. I understand  that the practice of medicine is not an exact science and that Practitioner makes no warranties or guarantees regarding treatment outcomes. I acknowledge that I will receive a copy of this consent concurrently upon execution via email to the email address I last provided but may also request a printed copy by calling the office of [facility].    I understand that my insurance will be billed for this visit.   I have read or had this consent read to me. . I understand the contents of this consent, which adequately explains the benefits and risks of the Services being provided via telemedicine.  . I have been provided ample opportunity to ask questions regarding this consent and the Services and have had  my questions answered to my satisfaction. . I give my informed consent for the services to be provided through the use of telemedicine in my medical care  By participating in this telemedicine visit I agree to the above.

## 2018-12-07 NOTE — Patient Instructions (Signed)
Medication Instructions:   Your physician recommends that you continue on your current medications as directed. Please refer to the Current Medication list given to you today.  If you need a refill on your cardiac medications before your next appointment, please call your pharmacy.   Lab work:  None ordered today  If you have labs (blood work) drawn today and your tests are completely normal, you will receive your results only by: Marland Kitchen MyChart Message (if you have MyChart) OR . A paper copy in the mail If you have any lab test that is abnormal or we need to change your treatment, we will call you to review the results.  Testing/Procedures:  Non-Cardiac CT Angiography (CTA), is a special type of CT scan that uses a computer to produce multi-dimensional views of major blood vessels throughout the body. In CT angiography, a contrast material is injected through an IV to help visualize the blood vessels   Follow-Up: At [facility], you and your health needs are our priority.  As part of our continuing mission to provide you with exceptional heart care, we have created designated Provider Care Teams.  These Care Teams include your primary Cardiologist (physician) and Advanced Practice Providers (APPs -  Physician Assistants and Nurse Practitioners) who all work together to provide you with the care you need, when you need it. You will need a follow up appointment in:  12 months.  Please call our office 2 months in advance to schedule this appointment.  You may see Mertie Moores, MD or one of the following Advanced Practice Providers on your designated Care Team: Richardson Dopp, PA-C [place], [place] . Daune Perch, NP

## 2018-12-07 NOTE — Progress Notes (Signed)
Virtual Visit via Video Note   This visit type was conducted due to national recommendations for restrictions regarding the COVID-19 Pandemic (e.g. social distancing) in an effort to limit this patient's exposure and mitigate transmission in our community.  Due to his co-morbid illnesses, this patient is at least at moderate risk for complications without adequate follow up.  This format is felt to be most appropriate for this patient at this time.  All issues noted in this document were discussed and addressed.  A limited physical exam was performed with this format.  Please refer to the patient's chart for his consent to telehealth for [facility].   Date:  12/07/2018   ID:  Pritesh, Sobecki 1950/08/05, MRN [id number]  Patient Location: Home Provider Location: Home  PCP:  Lyndee Hensen, MD  Cardiologist:  Mertie Moores, MD   Electrophysiologist:  None   Evaluation Performed:  Follow-Up Visit  Chief Complaint: Paroxysmal atrial fibrillation, thoracic aortic aneurysm  History of Present Illness:    Jeremiah Carson is a 68 y.o. male with:  Paroxysmal atrial fibrillation/flutter  Originally noted in the setting of viral illness in 2018 >> converted to normal sinus rhythm   CHADS2-VASc=1 (age x 1) >> no anticoagulation    Thoracic aortic aneurysm (41 mm by echocardiogram 2/18)  History of Rocky [place] spotted fever  Ex tobacco smoker  Mr. Blitch was last seen by Dr. Acie Fredrickson in January 2019.  Today, he notes he is doing well.  He has not had any recurrent symptoms of atrial fibrillation.  He has not had chest discomfort, significant shortness of breath, syncope.  He does continue to smoke but has reduced his quantity.  The patient does not have symptoms concerning for COVID-19 infection (fever, chills, cough, or new shortness of breath).    Past Medical History:  Diagnosis Date  . Cervical disc disease   . [place] spotted fever age 54  . Tobacco use     Past Surgical History:  Procedure Laterality Date  . HERNIA REPAIR Bilateral   . ORCHIECTOMY Left   . TRACHEOSTOMY    . VASECTOMY       Current Meds  Medication Sig  . ANORO ELLIPTA 62.5-25 MCG/INH AEPB USE 1 INHALATION BY MOUTH  DAILY  . aspirin EC 81 MG tablet Take 1 tablet (81 mg total) by mouth daily.     Allergies:   Penicillins   Social History   Tobacco Use  . Smoking status: Current Every Day Smoker    Packs/day: 1.00    Years: 46.00    Pack years: 46.00    Types: Cigarettes    Start date: 04/20/1969  . Smokeless tobacco: Never Used  . 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 Alcohol use: No  . Drug use: No     Family Hx: The patient's family history includes Cancer in his mother; Suicidality in his sister; Unexplained death in his brother and father.  ROS:   Please see the history of present illness.     All other systems reviewed and are negative.   Prior CV studies:   The following studies were reviewed today:   Echocardiogram 05/25/2016 Mild concentric LVH, EF 50-55, normal wall motion, possible bicuspid aortic valve, mildly dilated ascending aorta, mildly reduced RV SF, mild RAE, mild TR  Labs/Other Tests and Data Reviewed:    EKG:  No ECG reviewed.  Recent Labs:  No results found for requested labs within last 8760 hours.   Recent Lipid Panel No results found for: CHOL, TRIG, HDL, CHOLHDL, LDLCALC, LDLDIRECT  Wt Readings from Last 3 Encounters:  12/07/18 210 lb (95.3 kg)  12/15/17 211 lb (95.7 kg)  04/30/17 217 lb (98.4 kg)     Objective:    Vital Signs:  Ht 6\' 5"  (1.956 m)   Wt 210 lb (95.3 kg)   BMI 24.90 kg/m    VITAL SIGNS:  reviewed GEN:  no acute distress EYES:  sclerae anicteric, EOMI - Extraocular Movements Intact RESPIRATORY:  Normal respiratory effort NEURO:  Alert and oriented PSYCH:  normal affect  ASSESSMENT & PLAN:    1. PAF (paroxysmal atrial fibrillation)  (HCC) No recurrent symptoms of atrial fibrillation.  His stroke risk is low.  He does not require long-term anticoagulation.  He knows to contact us if he has symptoms consistent with recurrent atrial fibrillation.  2. Thoracic aortic aneurysm without rupture (HCC) 4.1 cm by echocardiogram in 2018.  Arrange chest CT angiogram for surveillance of thoracic aortic aneurysm.  This will be arranged sometime in the winter, which is more convenient for him.  3. Tobacco abuse I have recommended cessation.  4. Educated About Covid-19 Virus Infection The signs and symptoms of COVID-19 were discussed with the patient and how to seek care for testing (follow up with PCP or arrange E-visit).  The importance of social distancing was discussed today.  Time:   Today, I have spent 11 minutes with the patient with telehealth technology discussing the above problems.     Medication Adjustments/Labs and Tests Ordered: Current medicines are reviewed at length with the patient today.  Concerns regarding medicines are outlined above.   Tests Ordered: Orders Placed This Encounter  Procedures  . CT CORONARY MORPH W/CTA COR W/SCORE W/CA W/CM &/OR WO/CM  . CT CORONARY FRACTIONAL FLOW RESERVE DATA PREP  . CT CORONARY FRACTIONAL FLOW RESERVE FLUID ANALYSIS    Medication Changes: No orders of the defined types were placed in this encounter.   Follow Up:  Virtual Visit or In Person in 1 year(s)  Signed, Tereso NewcomerScott Viviann Broyles, PA-C  12/07/2018 5:13 PM    [facility]

## 2019-02-01 ENCOUNTER — Ambulatory Visit (INDEPENDENT_AMBULATORY_CARE_PROVIDER_SITE_OTHER): Payer: 59 | Admitting: Family Medicine

## 2019-02-01 ENCOUNTER — Other Ambulatory Visit: Payer: Self-pay

## 2019-02-01 ENCOUNTER — Encounter: Payer: Self-pay | Admitting: Family Medicine

## 2019-02-01 VITALS — BP 134/80 | HR 74 | Wt 217.0 lb

## 2019-02-01 DIAGNOSIS — Z0000 Encounter for general adult medical examination without abnormal findings: Secondary | ICD-10-CM | POA: Diagnosis not present

## 2019-02-01 DIAGNOSIS — Z720 Tobacco use: Secondary | ICD-10-CM

## 2019-02-01 DIAGNOSIS — J42 Unspecified chronic bronchitis: Secondary | ICD-10-CM

## 2019-02-01 DIAGNOSIS — I480 Paroxysmal atrial fibrillation: Secondary | ICD-10-CM | POA: Diagnosis not present

## 2019-02-01 DIAGNOSIS — D649 Anemia, unspecified: Secondary | ICD-10-CM | POA: Diagnosis not present

## 2019-02-01 DIAGNOSIS — Z23 Encounter for immunization: Secondary | ICD-10-CM

## 2019-02-01 MED ORDER — ALBUTEROL SULFATE HFA 108 (90 BASE) MCG/ACT IN AERS: 2.0000 | INHALATION_SPRAY | Freq: Four times a day (QID) | RESPIRATORY_TRACT | 5 refills | Status: DC | PRN

## 2019-02-01 MED ORDER — ANORO ELLIPTA 62.5-25 MCG/INH IN AEPB: INHALATION_SPRAY | RESPIRATORY_TRACT | 2 refills | Status: DC

## 2019-02-01 NOTE — Assessment & Plan Note (Addendum)
Patient has significantly reduced his tobacco consumption (1 to 2 packs/day to 2 to 3 cigarettes daily). Smoking cessation instruction/counseling given:  counseled patient on the dangers of tobacco use, advised patient to stop smoking, and reviewed strategies to maximize success.  Commended patient for his tobacco use reduction.  We will continue to encourage patient to completely quit smoking.

## 2019-02-01 NOTE — Progress Notes (Signed)
    Subjective:  Jeremiah Carson is a 68 y.o. male who presents to the [facility] today with a chief complaint of  Chief Complaint  Patient presents with  . Medication Refill    HPI:   [name] here for follow-up and medication management.  pAFIB No concerns regarding his A. fib.  Denies chest pain, palpitations, shortness of breath, loss of consciousness and weakness. Follows with Dr. Acie Fredrickson, cardiologist.    COPD Endorses nonproductive cough.  Uses Anoro inhaler daily.  Not needed his rescue inhaler for quite some time.  Continues to smoke cigarettes daily.  Denies  SOB, exertional dyspnea, palpitations, wheezing, chest discomfort and increased sputum production.   Tobacco abuse  States that he "quit smoking" several years ago however continues to smoke 2 to 3 cigarettes daily.   PMHx: pAFIB, COPD, Tobacco abuse, thoracic aortic dilation.  [place], medications and smoking status reviewed.     ROS: See HPI     Objective:  Physical Exam: BP 134/80   Pulse 74   Wt 217 lb (98.4 kg)   SpO2 95%   BMI 25.73 kg/m    GEN: Pleasant, in no acute distress CV: regular rate and rhythm, no murmurs appreciated  RESP: no increased work of breathing, expiratory wheezing bilaterally  MSK: no lower extremity edema SKIN: warm, dry NEURO: grossly normal, moves all extremities appropriately PSYCH: Normal affect and thought content   No results found for this or any previous visit (from the past 72 hour(s)).   Assessment/Plan:  PAF (paroxysmal atrial fibrillation) (HCC) Stable. AFIB: CHADs VASc score: 1 (age).   No anticoagulation recommended at this time.  Continue to follow with Dr. Acie Fredrickson (cardiology).   COPD (chronic obstructive pulmonary disease) (HCC) Stable.  Expiratory wheezing on exam.  Advised patient to use albuterol inhaler as needed.  No changes to home regimen at this time. Refilled prescriptions for Anoro ellipta and albuterol.  Counseled patient on the need to  quit smoking.   Tobacco abuse Patient has significantly reduced his tobacco consumption (1 to 2 packs/day to 2 to 3 cigarettes daily). Smoking cessation instruction/counseling given:  counseled patient on the dangers of tobacco use, advised patient to stop smoking, and reviewed strategies to maximize success.  Commended patient for his tobacco use reduction.  We will continue to encourage patient to completely quit smoking.    Orders Placed This Encounter  Procedures  . Flu Vaccine QUAD 36+ mos IM  . Tdap vaccine greater than or equal to 7yo IM  . CBC  . Hepatitis c antibody (reflex)  . Lipid Panel    Standing Status:   Future    Standing Expiration Date:   02/01/2020  . Cologuard  . Comprehensive metabolic panel        Meds ordered this encounter  Medications  . albuterol (VENTOLIN HFA) 108 (90 Base) MCG/ACT inhaler    Sig: Inhale 2 puffs into the lungs every 6 (six) hours as needed for wheezing or shortness of breath.    Dispense:  6.7 g    Refill:  5  . umeclidinium-vilanterol (ANORO ELLIPTA) 62.5-25 MCG/INH AEPB    Sig: USE 1 INHALATION BY MOUTH  DAILY    Dispense:  180 each    Refill:  2    Health Maintenance reviewed - influenza and tetanus given. Cologard ordered.  Hep C ordered. Patient declined pneumococcal vaccine.   Lyndee Hensen, DO PGY-1, [facility] 02/01/2019 3:57 PM

## 2019-02-01 NOTE — Assessment & Plan Note (Signed)
Stable. AFIB: CHADs VASc score: 1 (age).   No anticoagulation recommended at this time.  Continue to follow with Dr. Acie Fredrickson (cardiology).

## 2019-02-01 NOTE — Patient Instructions (Signed)
It was great seeing you today!  -Prescriptions were sent to your pharmacy. -Continue to decrease number of cigarettes used.  - Make an appointment for fasting blood work to check your cholesterol.   Please check-out at the front desk before leaving the clinic. I'd like to see you back 6 months but if you need to be seen earlier than that for any new issues we're happy to fit you in, just give [place] a call!  We are checking some labs today. If results require attention, either myself or my nurse will get in touch with you. If everything is normal, you will get a letter in the mail or a message in My Chart. Please give [place] a call if you do not hear from [place] after 2 weeks.   Please bring all of your medications with you to each visit.    Sign up for My Chart to have easy access to your labs results, and communication with your primary care physician.  Feel free to call with any questions or concerns at any time, at [phone].   Take care,  Dr. Rushie [facility]

## 2019-02-01 NOTE — Assessment & Plan Note (Addendum)
Stable.  Expiratory wheezing on exam.  Advised patient to use albuterol inhaler as needed.  No changes to home regimen at this time. Refilled prescriptions for Anoro ellipta and albuterol.  Counseled patient on the need to quit smoking.

## 2019-02-02 ENCOUNTER — Encounter: Payer: Self-pay | Admitting: Family Medicine

## 2019-02-02 LAB — CBC
Hematocrit: 44.4 % (ref 37.5–51.0)
Hemoglobin: 15.5 g/dL (ref 13.0–17.7)
MCH: 32.5 pg (ref 26.6–33.0)
MCHC: 34.9 g/dL (ref 31.5–35.7)
MCV: 93 fL (ref 79–97)
Platelets: 298 10*3/uL (ref 150–450)
RBC: 4.77 x10E6/uL (ref 4.14–5.80)
RDW: 12.7 % (ref 11.6–15.4)
WBC: 7.4 10*3/uL (ref 3.4–10.8)

## 2019-02-02 LAB — COMPREHENSIVE METABOLIC PANEL
ALT: 16 IU/L (ref 0–44)
AST: 21 IU/L (ref 0–40)
Albumin/Globulin Ratio: 2 (ref 1.2–2.2)
Albumin: 4.6 g/dL (ref 3.8–4.8)
Alkaline Phosphatase: 52 IU/L (ref 39–117)
BUN/Creatinine Ratio: 8 — ABNORMAL LOW (ref 10–24)
BUN: 7 mg/dL — ABNORMAL LOW (ref 8–27)
Bilirubin Total: 0.3 mg/dL (ref 0.0–1.2)
CO2: 24 mmol/L (ref 20–29)
Calcium: 9.9 mg/dL (ref 8.6–10.2)
Chloride: 98 mmol/L (ref 96–106)
Creatinine, Ser: 0.87 mg/dL (ref 0.76–1.27)
GFR calc Af Amer: 104 mL/min/{1.73_m2} (ref >59)
GFR calc non Af Amer: 90 mL/min/{1.73_m2} (ref >59)
Globulin, Total: 2.3 g/dL (ref 1.5–4.5)
Glucose: 86 mg/dL (ref 65–99)
Potassium: 4.5 mmol/L (ref 3.5–5.2)
Sodium: 136 mmol/L (ref 134–144)
Total Protein: 6.9 g/dL (ref 6.0–8.5)

## 2019-02-02 LAB — HEPATITIS C ANTIBODY (REFLEX): HCV Ab: <0.1 s/co ratio (ref 0.0–0.9)

## 2019-02-02 LAB — HCV COMMENT:

## 2019-02-22 LAB — COLOGUARD: Cologuard: NEGATIVE

## 2019-03-02 ENCOUNTER — Telehealth: Payer: Self-pay | Admitting: Family Medicine

## 2019-03-02 NOTE — Telephone Encounter (Signed)
Spoke with patient regarding his negative Cologuard results.  Repeat colonoscopy in 3 years or Cologuard if patient is unwilling to have colonoscopy.

## 2019-09-29 ENCOUNTER — Other Ambulatory Visit: Payer: Self-pay | Admitting: Family Medicine

## 2019-09-29 DIAGNOSIS — J42 Unspecified chronic bronchitis: Secondary | ICD-10-CM

## 2020-08-14 ENCOUNTER — Other Ambulatory Visit: Payer: Self-pay | Admitting: Family Medicine

## 2020-08-14 DIAGNOSIS — J42 Unspecified chronic bronchitis: Secondary | ICD-10-CM

## 2020-08-15 NOTE — Telephone Encounter (Signed)
Pt needs to be seen for COPD management. Three month Rx given. Has not been seen in office since Oct 2020.

## 2020-08-16 NOTE — Telephone Encounter (Signed)
Attempted to reach pt. No answer. LVM for patient to call the office to make appt for follow up on COPD. Aquilla Solian, CMA

## 2020-08-29 ENCOUNTER — Encounter: Payer: Self-pay | Admitting: Family Medicine

## 2020-08-29 ENCOUNTER — Ambulatory Visit (INDEPENDENT_AMBULATORY_CARE_PROVIDER_SITE_OTHER): Payer: 59 | Admitting: Family Medicine

## 2020-08-29 ENCOUNTER — Other Ambulatory Visit: Payer: Self-pay

## 2020-08-29 ENCOUNTER — Ambulatory Visit (INDEPENDENT_AMBULATORY_CARE_PROVIDER_SITE_OTHER): Payer: 59

## 2020-08-29 VITALS — BP 130/76 | HR 76 | Ht 77.0 in | Wt 221.4 lb

## 2020-08-29 DIAGNOSIS — Z720 Tobacco use: Secondary | ICD-10-CM

## 2020-08-29 DIAGNOSIS — Z1211 Encounter for screening for malignant neoplasm of colon: Secondary | ICD-10-CM | POA: Diagnosis not present

## 2020-08-29 DIAGNOSIS — I77819 Aortic ectasia, unspecified site: Secondary | ICD-10-CM

## 2020-08-29 DIAGNOSIS — I480 Paroxysmal atrial fibrillation: Secondary | ICD-10-CM

## 2020-08-29 DIAGNOSIS — Z23 Encounter for immunization: Secondary | ICD-10-CM

## 2020-08-29 DIAGNOSIS — J42 Unspecified chronic bronchitis: Secondary | ICD-10-CM

## 2020-08-29 MED ORDER — ANORO ELLIPTA 62.5-25 MCG/INH IN AEPB: INHALATION_SPRAY | RESPIRATORY_TRACT | 0 refills | Status: DC

## 2020-08-29 MED ORDER — ALBUTEROL SULFATE HFA 108 (90 BASE) MCG/ACT IN AERS: 2.0000 | INHALATION_SPRAY | Freq: Four times a day (QID) | RESPIRATORY_TRACT | 5 refills | Status: DC | PRN

## 2020-08-29 NOTE — Progress Notes (Signed)
   SUBJECTIVE:   CHIEF COMPLAINT / HPI:   Chief Complaint  Patient presents with  . Follow-up     Jeremiah Carson is a 70 y.o. male here for follow COPD follow up. Patient requests refills on his inhalers. Has not needed his rescue inhaler in a while. Continues to her his controlled (Anoro) inhaler as prescribed. Denies increased cough or sputum production.  Continues to smoke about 5 cigarettes daily.   PERTINENT  PMH / PSH: reviewed and updated as appropriate   OBJECTIVE:   BP 130/76   Pulse 76   Ht 6\' 5"  (1.956 m)   Wt 221 lb 6 oz (100.4 kg)   SpO2 93%   BMI 26.25 kg/m    GEN: Well-appearing male, in no acute distress  CV: regular rate and rhythm, no murmurs, rubs, gallops appreciated  RESP: no increased work of breathing, expiratory wheezes, no rales, no rhonchi  SKIN: warm, dry NEURO: Normal speech, alert and oriented   ASSESSMENT/PLAN:   PAF (paroxysmal atrial fibrillation) (HCC) Regular rate and rhythm on exam.  CHA2DS2-VASc score of 1.  No anticoagulation recommended at this time.  Follow-up with new cardiologist.   COPD (chronic obstructive pulmonary disease) (HCC) Stable.  On exam, expiratory wheezing present.  Failed rescue and controller inhalers.  Continue Anoro Ellipta and albuterol.  Counseled patient on the need to quit smoking.   Aortic dilatation [facility]) Patient reports he is switching to his wife's cardiologist.  His echo showed a dilation of thoracic aorta.  Advised patient to speak with his new cardiologist about this finding.  Previous cardiologist wanted to do a CT scan.  Tobacco abuse Tobacco consumption slightly increased from 2 to 3 cigarettes to 5 cigarettes daily.  Smokes cessation counseling provided.  Counseled patient on the dangers of tobacco use, advised patient to stop smoking and reviewed strategies to maximize success.  Colon cancer screening Patient declined colonoscopy.  Last Cologuard was greater than 3 years ago was negative.   Cologuard ordered.   Patient declined blood work today.  [facility], DO PGY-2, [facility] 08/29/2020

## 2020-08-29 NOTE — Patient Instructions (Addendum)
It was great seeing you today!  Please check-out at the front desk before leaving the clinic. I'd like to see you back in 6 months but if you need to be seen earlier than that for any new issues we're happy to fit you in, just give [place] a call!  Visit Remembers: - Stop by the pharmacy to pick up your prescriptions  - Continue to work on your healthy eating habits and incorporating exercise into your daily life.  - Your goal is to have an BP < 135/85 - Be sure to follow up with your cardiologist regarding your aortic dilation that was seen on your ECHO a few years ago. Your previous cardiologist recommended a CT scan to better evaluate.   Regarding lab work today:  Due to recent changes in healthcare laws, you may see the results of your imaging and laboratory studies on MyChart before your provider has had a chance to review them.  I understand that in some cases there may be results that are confusing or concerning to you. Not all laboratory results come back in the same time frame and you may be waiting for multiple results in order to interpret others.  Please give [place] 72 hours in order for your provider to thoroughly review all the results before contacting the office for clarification of your results. If everything is normal, you will get a letter in the mail or a message in My Chart. Please give [place] a call if you do not hear from [place] after 2 weeks.  Please bring all of your medications with you to each visit.    If you haven't already, sign up for My Chart to have easy access to your labs results, and communication with your primary care physician.  Feel free to call with any questions or concerns at any time, at [phone].   Take care,  Dr. Katherina [facility]

## 2020-08-30 DIAGNOSIS — I77819 Aortic ectasia, unspecified site: Secondary | ICD-10-CM | POA: Insufficient documentation

## 2020-08-30 DIAGNOSIS — Z1211 Encounter for screening for malignant neoplasm of colon: Secondary | ICD-10-CM | POA: Insufficient documentation

## 2020-08-30 NOTE — Assessment & Plan Note (Signed)
Tobacco consumption slightly increased from 2 to 3 cigarettes to 5 cigarettes daily.  Smokes cessation counseling provided.  Counseled patient on the dangers of tobacco use, advised patient to stop smoking and reviewed strategies to maximize success.

## 2020-08-30 NOTE — Assessment & Plan Note (Signed)
Patient reports he is switching to his wife's cardiologist.  His echo showed a dilation of thoracic aorta.  Advised patient to speak with his new cardiologist about this finding.  Previous cardiologist wanted to do a CT scan.

## 2020-08-30 NOTE — Assessment & Plan Note (Signed)
Stable.  On exam, expiratory wheezing present.  Failed rescue and controller inhalers.  Continue Anoro Ellipta and albuterol.  Counseled patient on the need to quit smoking.

## 2020-08-30 NOTE — Assessment & Plan Note (Signed)
Patient declined colonoscopy.  Last Cologuard was greater than 3 years ago was negative.  Cologuard ordered.

## 2020-08-30 NOTE — Assessment & Plan Note (Signed)
Regular rate and rhythm on exam.  CHA2DS2-VASc score of 1.  No anticoagulation recommended at this time.  Follow-up with new cardiologist.

## 2020-09-14 LAB — COLOGUARD: Cologuard: NEGATIVE

## 2020-10-16 ENCOUNTER — Other Ambulatory Visit: Payer: Self-pay | Admitting: Family Medicine

## 2020-10-16 ENCOUNTER — Other Ambulatory Visit: Payer: Self-pay

## 2020-10-16 ENCOUNTER — Ambulatory Visit (INDEPENDENT_AMBULATORY_CARE_PROVIDER_SITE_OTHER): Payer: 59 | Admitting: Orthopaedic Surgery

## 2020-10-16 ENCOUNTER — Ambulatory Visit: Payer: Self-pay

## 2020-10-16 VITALS — BP 125/81 | HR 79 | Ht 76.0 in | Wt 205.0 lb

## 2020-10-16 DIAGNOSIS — J42 Unspecified chronic bronchitis: Secondary | ICD-10-CM

## 2020-10-16 DIAGNOSIS — M25511 Pain in right shoulder: Secondary | ICD-10-CM | POA: Diagnosis not present

## 2020-10-16 DIAGNOSIS — M542 Cervicalgia: Secondary | ICD-10-CM

## 2020-10-16 NOTE — Progress Notes (Signed)
Office Visit Note   Patient: Jeremiah Carson           Date of Birth: January 16, 1951           MRN: [id number] Visit Date: 10/16/2020              Requested by: Katha Cabal, DO [address] [place],  [place] [address] PCP: Katha Cabal, DO   Assessment & Plan: Visit Diagnoses:  1. Neck pain   2. Acute pain of right shoulder     Plan: Conservative treatment recommended.  Recheck 6 weeks.  He can use some Aleve or Tylenol.  Rotator cuff appears intact on exam.  Follow-Up Instructions: No follow-ups on file.   Orders:  Orders Placed This Encounter  Procedures   XR Cervical Spine 2 or 3 views   XR Shoulder Right   No orders of the defined types were placed in this encounter.     Procedures: No procedures performed   Clinical Data: No additional findings.   Subjective: Chief Complaint  Patient presents with   Neck - Pain   Right Shoulder - Pain    HPI six 70-year-old male returns he states he is up with his dog near the [facility] slipped and strained landed on a rock with his right shoulder with right shoulder pain.  This was a couple weeks ago.  He has had some increased problems with his neck since then and he has had previous C5-6, C6-7 iliac crest donor two-level Cloward cervical fusion in the 90s.  He had some adjacent changes at C3-4 and C4-5 above the fusion noted on MRI scan done in 2015 and we had discussed disc arthroplasty at that time.  Patient continues to be active he states he has some right shoulder pain with outstretched reaching.  Continues stiffness and soreness in his neck that gradually seems to be worsening over the years.  He feels like his neck is worse since the fall.  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  He states he did not request repeat evaluation of his neck back in the 90s when he had the two-level cervical fusion.  Patient had some decreased range  of motion of his neck some pain with lifting activities increased pain in his right shoulder with outstretch and overhead activity currently which seems worse since his fall.  Positive for history of atrial fib, COPD.  He is on no pain medication.  Review of Systems No chest pain no history of stroke all other systems noncontributory to HPI.  Objective: Vital Signs: BP 125/81   Pulse 79   Ht 6\' 4"  (1.93 m)   Wt 205 lb (93 kg)   BMI 24.95 kg/m   Physical Exam Constitutional:      Appearance: He is well-developed.  HENT:     Head: Normocephalic and atraumatic.     Right Ear: External ear normal.     Left Ear: External ear normal.  Eyes:     Pupils: Pupils are equal, round, and reactive to light.  Neck:     Thyroid: No thyromegaly.     Trachea: No tracheal deviation.  Cardiovascular:     Rate and Rhythm: Normal rate.  Pulmonary:     Effort: Pulmonary effort is normal.     Breath sounds: No wheezing.  Abdominal:     General: Bowel sounds are normal.  Palpations: Abdomen is soft.  Musculoskeletal:     Cervical back: Neck supple.  Skin:    General: Skin is warm and dry.     Capillary Refill: Capillary refill takes less than 2 seconds.  Neurological:     Mental Status: He is alert and oriented to person, place, and time.  Psychiatric:        Behavior: Behavior normal.        Thought Content: Thought content normal.        Judgment: Judgment normal.    Ortho Exam patient has mild positive impingement right shoulder negative drop arm test.  Supraspinatus internal and external rotation shoulder strong.  50% cervical range of motion with mild discomfort.  Negative Spurling.  Upper extremity reflexes 2+ and symmetrical no lower extremity clonus.  Specialty Comments:  No specialty comments available.  Imaging: No results found.   PMFS History: Patient Active Problem List   Diagnosis Date Noted   Colon cancer screening 08/30/2020   Aortic dilatation (HCC) 08/30/2020    PAF (paroxysmal atrial fibrillation) (HCC) 09/11/2016   COPD (chronic obstructive pulmonary disease) (HCC) 06/11/2016   Tobacco abuse    Past Medical History:  Diagnosis Date   Cervical disc disease    Rocky Mountain spotted fever age 70   Tobacco use     Family History  Problem Relation Age of Onset   Cancer Mother        lung   Unexplained death Father    Suicidality Sister    Unexplained death Brother     Past Surgical History:  Procedure Laterality Date   HERNIA REPAIR Bilateral    ORCHIECTOMY Left    TRACHEOSTOMY     VASECTOMY     Social History   Occupational History   Not on file  Tobacco Use   Smoking status: Every Day    Packs/day: 1.00    Years: 46.00    Pack years: 46.00    Types: Cigarettes    Start date: 04/20/1969   Smokeless tobacco: Never   Tobacco comments:    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

## 2020-11-05 ENCOUNTER — Encounter: Payer: Self-pay | Admitting: Cardiovascular Disease

## 2020-11-05 NOTE — Telephone Encounter (Signed)
error 

## 2020-11-15 ENCOUNTER — Ambulatory Visit: Payer: 59 | Admitting: Orthopaedic Surgery

## 2020-11-29 ENCOUNTER — Telehealth: Payer: Self-pay | Admitting: Orthopaedic Surgery

## 2020-11-29 NOTE — Telephone Encounter (Signed)
Received vm from pt wanting to get copy of records. IC,lmvm advised need to come in and sign release form. [phone]

## 2020-12-03 ENCOUNTER — Ambulatory Visit (INDEPENDENT_AMBULATORY_CARE_PROVIDER_SITE_OTHER): Payer: 59 | Admitting: Orthopaedic Surgery

## 2020-12-03 ENCOUNTER — Other Ambulatory Visit: Payer: Self-pay

## 2020-12-03 ENCOUNTER — Encounter: Payer: Self-pay | Admitting: Orthopaedic Surgery

## 2020-12-03 DIAGNOSIS — Z981 Arthrodesis status: Secondary | ICD-10-CM

## 2020-12-03 DIAGNOSIS — S92354D Nondisplaced fracture of fifth metatarsal bone, right foot, subsequent encounter for fracture with routine healing: Secondary | ICD-10-CM

## 2020-12-03 NOTE — Progress Notes (Signed)
Office Visit Note   Patient: Jeremiah Carson           Date of Birth: 1950/05/18           MRN: [id number] Visit Date: 12/03/2020              Requested by: Katha Cabal, DO [address] [place],  [place] [address] PCP: Katha Cabal, DO   Assessment & Plan: Visit Diagnoses: History of cervical fusion C5-C7.   Plan: The level cervical fusion years ago healed with some kyphosis with fusion C4-C7 solid.  No significant adjacent level changes seen on plain radiographs. Grafts are unchanged from 01/11/2014 images cervical spine.  No with radiculopathy on exam today.  He can follow-up if he has persistent problems.  Follow-Up Instructions: No follow-ups on file.   Orders:  No orders of the defined types were placed in this encounter.  No orders of the defined types were placed in this encounter.     Procedures: No procedures performed   Clinical Data: No additional findings.   Subjective: Chief Complaint  Patient presents with   Right Shoulder - Follow-up   Neck - Follow-up    HPI 70 year old male seen in follow-up post fall near the [facility] where he slipped landing on a rock on his right shoulder with right shoulder pain.  He states he still having some aching pain more with movement but has had a gradual increase range of motion.  Not taking medications for this.  He tends to keep his arm in an elevated position at night which is more comfortable for him.  Past problems with his neck in the past he was 10% service-connected and is wondering if he should have this increased.  I discussed with him this to be something he have to discuss with the [place].  X-ray 10/17/2018 demonstrated two-level cervical fusion C5-C7 without instrumentation.  Review of Systems all the systems noncontributory to HPI updated from 10/16/2020 office visit.   Objective: Vital Signs: BP 138/87 (BP Location: Left Arm, Patient Position: Sitting)   Pulse 81   Ht 6\' 4"  (1.93 m)   Wt 220 lb  12.8 oz (100.2 kg)   SpO2 92%   BMI 26.88 kg/m   Physical Exam Constitutional:      Appearance: He is well-developed.  HENT:     Head: Normocephalic and atraumatic.     Right Ear: External ear normal.     Left Ear: External ear normal.  Eyes:     Pupils: Pupils are equal, round, and reactive to light.  Neck:     Thyroid: No thyromegaly.     Trachea: No tracheal deviation.  Cardiovascular:     Rate and Rhythm: Normal rate.  Pulmonary:     Effort: Pulmonary effort is normal.     Breath sounds: No wheezing.  Abdominal:     General: Bowel sounds are normal.     Palpations: Abdomen is soft.  Musculoskeletal:     Cervical back: Neck supple.  Skin:    General: Skin is warm and dry.     Capillary Refill: Capillary refill takes less than 2 seconds.  Neurological:     Mental Status: He is alert and oriented to person, place, and time.  Psychiatric:        Behavior: Behavior normal.        Thought Content: Thought content normal.        Judgment: Judgment normal.    Ortho Exam patient has  no brachial plexus tenderness upper extremity reflexes are 2+ and symmetrical.  Negative impingement of the shoulders.  Specialty Comments:  No specialty comments available.  Imaging: No results found.   PMFS History: Patient Active Problem List   Diagnosis Date Noted   Hx of fusion of cervical spine 12/12/2020   Colon cancer screening 08/30/2020   Aortic dilatation (HCC) 08/30/2020   PAF (paroxysmal atrial fibrillation) (HCC) 09/11/2016   COPD (chronic obstructive pulmonary disease) (HCC) 06/11/2016   Tobacco abuse    Past Medical History:  Diagnosis Date   Cervical disc disease    Rocky Mountain spotted fever age 54   Tobacco use     Family History  Problem Relation Age of Onset   Cancer Mother        lung   Unexplained death Father    Suicidality Sister    Unexplained death Brother     Past Surgical History:  Procedure Laterality Date   HERNIA REPAIR Bilateral     ORCHIECTOMY Left    TRACHEOSTOMY     VASECTOMY     Social History   Occupational History   Not on file  Tobacco Use   Smoking status: Every Day    Packs/day: 1.00    Years: 46.00    Pack years: 46.00    Types: Cigarettes    Start date: 04/20/1969   Smokeless tobacco: Never   Tobacco comments:    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

## 2020-12-04 DIAGNOSIS — S92353A Displaced fracture of fifth metatarsal bone, unspecified foot, initial encounter for closed fracture: Secondary | ICD-10-CM | POA: Insufficient documentation

## 2020-12-12 DIAGNOSIS — Z981 Arthrodesis status: Secondary | ICD-10-CM | POA: Insufficient documentation

## 2021-01-21 ENCOUNTER — Ambulatory Visit (INDEPENDENT_AMBULATORY_CARE_PROVIDER_SITE_OTHER): Payer: 59 | Admitting: Cardiovascular Disease

## 2021-01-21 ENCOUNTER — Encounter: Payer: Self-pay | Admitting: Cardiovascular Disease

## 2021-01-21 ENCOUNTER — Other Ambulatory Visit: Payer: Self-pay

## 2021-01-21 VITALS — BP 122/86 | HR 80 | Ht 76.0 in | Wt 221.6 lb

## 2021-01-21 DIAGNOSIS — Z720 Tobacco use: Secondary | ICD-10-CM

## 2021-01-21 DIAGNOSIS — I480 Paroxysmal atrial fibrillation: Secondary | ICD-10-CM

## 2021-01-21 DIAGNOSIS — I712 Thoracic aortic aneurysm, without rupture, unspecified: Secondary | ICD-10-CM

## 2021-01-21 DIAGNOSIS — I77819 Aortic ectasia, unspecified site: Secondary | ICD-10-CM | POA: Diagnosis not present

## 2021-01-21 LAB — LIPID PANEL
Chol/HDL Ratio: 7.9 ratio — ABNORMAL HIGH (ref 0.0–5.0)
Cholesterol, Total: 237 mg/dL — ABNORMAL HIGH (ref 100–199)
HDL: 30 mg/dL — ABNORMAL LOW (ref >39)
LDL Chol Calc (NIH): 161 mg/dL — ABNORMAL HIGH (ref 0–99)
Triglycerides: 244 mg/dL — ABNORMAL HIGH (ref 0–149)
VLDL Cholesterol Cal: 46 mg/dL — ABNORMAL HIGH (ref 5–40)

## 2021-01-21 LAB — BASIC METABOLIC PANEL
BUN/Creatinine Ratio: 9 — ABNORMAL LOW (ref 10–24)
BUN: 8 mg/dL (ref 8–27)
CO2: 24 mmol/L (ref 20–29)
Calcium: 10.4 mg/dL — ABNORMAL HIGH (ref 8.6–10.2)
Chloride: 95 mmol/L — ABNORMAL LOW (ref 96–106)
Creatinine, Ser: 0.85 mg/dL (ref 0.76–1.27)
Glucose: 94 mg/dL (ref 70–99)
Potassium: 4.5 mmol/L (ref 3.5–5.2)
Sodium: 136 mmol/L (ref 134–144)
eGFR: 95 mL/min/{1.73_m2} (ref >59)

## 2021-01-21 NOTE — Patient Instructions (Signed)
Medication Instructions:  Your physician recommends that you continue on your current medications as directed. Please refer to the Current Medication list given to you today.  *If you need a refill on your cardiac medications before your next appointment, please call your pharmacy*   Lab Work: TODAY - BMET, Lipid If you have labs (blood work) drawn today and your tests are completely normal, you will receive your results only by: MyChart Message (if you have MyChart) OR A paper copy in the mail If you have any lab test that is abnormal or we need to change your treatment, we will call you to review the results.   Testing/Procedures: Non-Cardiac CT Angiography (CTA), is a special type of CT scan that uses a computer to produce multi-dimensional views of major blood vessels throughout the body. In CT angiography, a contrast material is injected through an IV to help visualize the blood vessels    Follow-Up: At [facility], you and your health needs are our priority.  As part of our continuing mission to provide you with exceptional heart care, we have created designated Provider Care Teams.  These Care Teams include your primary Cardiologist (physician) and Advanced Practice Providers (APPs -  Physician Assistants and Nurse Practitioners) who all work together to provide you with the care you need, when you need it.   Your next appointment:   1 year(s)  The format for your next appointment:   In Person  Provider:   Tereso Newcomer, PA-C

## 2021-01-21 NOTE — Progress Notes (Signed)
Cardiology Office Note   Date:  01/21/2021   ID:  Jeremiah Carson, Jeremiah Carson 1950/12/11, MRN [id number]  PCP:  Jeremiah Cabal, DO  Cardiologist:   Kristeen Miss, MD   Chief Complaint  Patient presents with   Atrial Fibrillation      History of Present Illness:  Jeremiah Carson is a 70 y.o. male who is being seen today for a follow up  evaluation of atrial fib.    I saw him originally in the hospital. He had Afib and converted with atrial fib.   The Afib was felt to be due to a viral illness Superimposed on COPD. He was also found to have an ascending aortic aneurysm measuring 41 mm by echo.   Feels great, Has not had any further episodes of atrial fibrillation. He is in the process of cutting back on his smoking.  Has had right leg swelling - has been present for several weeks.   April 30, 2017: Jeremiah Carson is doing well.  He was having some leg swelling the last time I saw him.  We were concerned for DVT.  Lower extremity venous duplex scan did not reveal any evidence of DVT.  Is feeling well.  He does some yard work. No further episodes of PAF  He is been trying to stop smoking.   January 21, 2021: Jeremiah Carson is seen today for follow-up of his paroxysmal atrial fibrillation. He is also been found to have a dilated ascending aorta. Has had virtual visit with Tereso Newcomer, PA during covid  Still smoking   Advised cessation Some exercise with yard work .  Had PAF associated with a covid infection  Is ot on anticoagulation    Past Medical History:  Diagnosis Date   Cervical disc disease    Rock[place] spotted fever age 40   Tobacco use     Past Surgical History:  Procedure Laterality Date   HERNIA REPAIR Bilateral    ORCHIECTOMY Left    TRACHEOSTOMY     VASECTOMY       Current Outpatient Medications  Medication Sig Dispense Refill   albuterol (VENTOLIN HFA) 108 (90 Base) MCG/ACT inhaler Inhale 2 puffs into the lungs every 6 (six) hours as needed for wheezing or  shortness of breath. 6.7 g 5   ANORO ELLIPTA 62.5-25 MCG/INH AEPB USE 1 INHALATION BY MOUTH  DAILY 180 each 3   No current facility-administered medications for this visit.    No flowsheet data found.    Allergies:   Penicillins    Social History:  The patient  reports that he has been smoking cigarettes. He started smoking about 51 years ago. He has a 46.00 pack-year smoking history. He has never used smokeless tobacco. He reports that he does not drink alcohol and does not use drugs.   Family History:  The patient's family history includes Cancer in his mother; Suicidality in his sister; Unexplained death in his brother and father.    ROS: As noted in current history.  Otherwise negative.  Physical Exam: Blood pressure 122/86, pulse 80, height 6\' 4"  (1.93 m), weight 221 lb 9.6 oz (100.5 kg), SpO2 95 %.  GEN:  Well nourished, well developed in no acute distress HEENT: Normal NECK: No JVD; No carotid bruits LYMPHATICS: No lymphadenopathy CARDIAC: RRR , no murmurs, rubs, gallops RESPIRATORY:  Clear to auscultation without rales, wheezing or rhonchi  ABDOMEN: Soft, non-tender, non-distended MUSCULOSKELETAL:  No edema; No deformity  SKIN: Warm and dry NEUROLOGIC:  Alert  and oriented x 3   EKG:    January 21, 2021: Normal sinus rhythm.  No ST or T wave changes.  Heart rate is 80.  Recent Labs: No results found for requested labs within last 8760 hours.    Lipid Panel No results found for: CHOL, TRIG, HDL, CHOLHDL, VLDL, LDLCALC, LDLDIRECT    Wt Readings from Last 3 Encounters:  01/21/21 221 lb 9.6 oz (100.5 kg)  12/03/20 220 lb 12.8 oz (100.2 kg)  10/16/20 205 lb (93 kg)      Other studies Reviewed: Additional studies/ records that were reviewed today include: . Review of the above records demonstrates:    ASSESSMENT AND PLAN:  1.  Paroxysmal atrial fib: He had a single episode of paroxysmal A. fib in the setting of a COVID infection.  I do not think that he  needs anticoagulation based on that 1 episode.   2.  Mild ascending  aortic dilatation: We will get a CT angiogram of his aorta for follow-up.  3.  Hyperlipidemia: We will draw fasting labs today.  We will have him return to see Tereso Newcomer in 1 year.   Current medicines are reviewed at length with the patient today.  The patient does not have concerns regarding medicines.  Labs/ tests ordered today include:   Orders Placed This Encounter  Procedures   CT ANGIO CHEST AORTA W/CM & OR WO/CM   Basic Metabolic Panel (BMET)   Lipid Profile   EKG 12-Lead      Disposition:   To see Tereso Newcomer, PA in 1 year.       Kristeen Miss, MD  01/21/2021 10:49 AM    [facility] [address] [place], [place], [place]  [address] Phone: [phone]; Fax: [phone]

## 2021-01-24 ENCOUNTER — Telehealth: Payer: Self-pay | Admitting: Cardiovascular Disease

## 2021-01-24 DIAGNOSIS — E785 Hyperlipidemia, unspecified: Secondary | ICD-10-CM

## 2021-01-24 MED ORDER — ROSUVASTATIN CALCIUM 20 MG PO TABS: 20.0000 mg | ORAL_TABLET | Freq: Every day | ORAL | 3 refills | Status: DC

## 2021-01-24 NOTE — Telephone Encounter (Signed)
Attempted to call patient x 3. Left detailed message for patient regarding the lab results and plan of care. Advised patient that I will schedule the lab appointment for 3 months and that this is in addition to the lab appointment he has in November prior to his CT. Advised him to call back with questions or concerns.

## 2021-01-24 NOTE — Telephone Encounter (Signed)
° °  Pt is returning call to get lab result °

## 2021-03-03 ENCOUNTER — Other Ambulatory Visit: Payer: 59

## 2021-03-03 ENCOUNTER — Other Ambulatory Visit: Payer: Self-pay

## 2021-03-03 DIAGNOSIS — I712 Thoracic aortic aneurysm, without rupture, unspecified: Secondary | ICD-10-CM

## 2021-03-03 DIAGNOSIS — I480 Paroxysmal atrial fibrillation: Secondary | ICD-10-CM

## 2021-03-03 DIAGNOSIS — Z720 Tobacco use: Secondary | ICD-10-CM

## 2021-03-03 DIAGNOSIS — I77819 Aortic ectasia, unspecified site: Secondary | ICD-10-CM

## 2021-03-05 LAB — BASIC METABOLIC PANEL
BUN/Creatinine Ratio: 8 — ABNORMAL LOW (ref 10–24)
BUN: 7 mg/dL — ABNORMAL LOW (ref 8–27)
CO2: 25 mmol/L (ref 20–29)
Calcium: 9.9 mg/dL (ref 8.6–10.2)
Chloride: 98 mmol/L (ref 96–106)
Creatinine, Ser: 0.91 mg/dL (ref 0.76–1.27)
Glucose: 81 mg/dL (ref 70–99)
Potassium: 4.3 mmol/L (ref 3.5–5.2)
Sodium: 141 mmol/L (ref 134–144)
eGFR: 92 mL/min/{1.73_m2} (ref >59)

## 2021-03-12 ENCOUNTER — Inpatient Hospital Stay: Admission: RE | Admit: 2021-03-12 | Payer: 59 | Source: Ambulatory Visit

## 2021-04-25 ENCOUNTER — Other Ambulatory Visit: Payer: 59

## 2021-09-23 ENCOUNTER — Encounter: Payer: Self-pay | Admitting: *Deleted

## 2021-10-28 ENCOUNTER — Encounter: Payer: Self-pay | Admitting: Student

## 2021-10-28 ENCOUNTER — Ambulatory Visit (INDEPENDENT_AMBULATORY_CARE_PROVIDER_SITE_OTHER): Payer: 59 | Admitting: Student

## 2021-10-28 VITALS — BP 126/70 | HR 63 | Ht 76.0 in | Wt 222.8 lb

## 2021-10-28 DIAGNOSIS — I77819 Aortic ectasia, unspecified site: Secondary | ICD-10-CM

## 2021-10-28 DIAGNOSIS — J42 Unspecified chronic bronchitis: Secondary | ICD-10-CM

## 2021-10-28 DIAGNOSIS — I480 Paroxysmal atrial fibrillation: Secondary | ICD-10-CM | POA: Diagnosis not present

## 2021-10-28 DIAGNOSIS — Z13228 Encounter for screening for other metabolic disorders: Secondary | ICD-10-CM | POA: Diagnosis not present

## 2021-10-28 DIAGNOSIS — Z1211 Encounter for screening for malignant neoplasm of colon: Secondary | ICD-10-CM

## 2021-10-28 DIAGNOSIS — Z720 Tobacco use: Secondary | ICD-10-CM

## 2021-10-28 MED ORDER — ANORO ELLIPTA 62.5-25 MCG/ACT IN AEPB: 1.0000 | INHALATION_SPRAY | Freq: Every day | RESPIRATORY_TRACT | 3 refills | Status: DC

## 2021-10-28 MED ORDER — ALBUTEROL SULFATE HFA 108 (90 BASE) MCG/ACT IN AERS: 2.0000 | INHALATION_SPRAY | Freq: Four times a day (QID) | RESPIRATORY_TRACT | 5 refills | Status: DC | PRN

## 2021-10-28 NOTE — Progress Notes (Unsigned)
    SUBJECTIVE:   CHIEF COMPLAINT / HPI: Med refill  COPD  Patient takes anoro ellipta daily and albuterol prn. He has not taken albuterol in a while. Feels controlled with these medications. No exacerbations recently.  PAF  Aortic Dilatation Has not seen cardiology since 2022. Would like to see wife's cardiologist make an appintment. Breathing been at baseline. BP today normotensive at 126/70. Patient does not want a referral to cardiology in office.  Tobacco Use Smoking cigarettes can be up to 1 PPD, he quit for a couple weeks but then restarted. His wife still works and it is her routine to smoke after work and he joins her. He is not ready to slow dow or quit.   Health maintenance Patient last got Cologaurd last year which was negative. Does not want referral for colonoscopy. Discussed pneumonia vaccine with him as well which he defers today.  PERTINENT  PMH / PSH: PAF not on AC, Aortic dilatation, COPD  OBJECTIVE:   BP 126/70   Pulse 63   Ht 6\' 4"  (1.93 m)   Wt 222 lb 12.8 oz (101.1 kg)   SpO2 93%   BMI 27.12 kg/m   General:NAD, awake, alert, responsive to questions Head: Normocephalic atraumatic CV: Regular rate and rhythm no murmurs rubs or gallops Respiratory: mild expiratory wheezes in mid-upper lung fields bilaterally,  chest rises symmetrically,  no increased work of breathing Abdomen: Soft, non-tender, non-distended Extremities: Moves upper and lower extremities freely, no edema in LE  ASSESSMENT/PLAN:   PAF (paroxysmal atrial fibrillation) (HCC) NSR on exam today. Patient is going to follow up with wife's cardiologist  Aortic dilatation [facility]) Has not seen new cardiologist yet. Previously was recommended a CT based on the echo finding which he has not gotten. Promises he will set up appointment with cardiologist in office today and does not want a referral from our office today. BP wnl. -monitor at f/u appts  Tobacco abuse Continues to smoke up to 1 PPD and  is not interested in cutting back or cessation today. Counseling performed today  Colon cancer screening Does not want colonoscopy referral. Last cologaurd last year was negative.  COPD (chronic obstructive pulmonary disease) (HCC) Stable currently, refilled anoro ellipta and albuterol. Encoiuraged use of albuterol if needing rescue medication. Counseled on tobacco cessation and getting pneumonia vaccine.  Encounter for screening for other metabolic disorders - Basic Metabolic Panel; Future - Lipid Panel; Future      [facility], MD [facility]

## 2021-10-28 NOTE — Patient Instructions (Addendum)
It was great to see you! Thank you for allowing me to participate in your care!   I recommend that you always bring your medications to each appointment as this makes it easy to ensure we are on the correct medications and helps [place] not miss when refills are needed.  Our plans for today:  - I have place future labs for you - I have refilled your inhaler - Let us know if wanting to quit smoking -please see cardiologist  Take care and seek immediate care sooner if you develop any concerns. Please remember to show up 15 minutes before your scheduled appointment time!  Levin Erp, MD [facility]

## 2021-10-29 NOTE — Assessment & Plan Note (Addendum)
Stable currently, refilled anoro ellipta and albuterol. Encoiuraged use of albuterol if needing rescue medication. Counseled on tobacco cessation and getting pneumonia vaccine.

## 2021-10-29 NOTE — Assessment & Plan Note (Addendum)
Continues to smoke up to 1 PPD and is not interested in cutting back or cessation today. Counseling performed today

## 2021-10-29 NOTE — Assessment & Plan Note (Signed)
Has not seen new cardiologist yet. Previously was recommended a CT based on the echo finding which he has not gotten. Promises he will set up appointment with cardiologist in office today and does not want a referral from our office today. BP wnl. -monitor at f/u appts

## 2021-10-29 NOTE — Assessment & Plan Note (Signed)
Does not want colonoscopy referral. Last cologaurd last year was negative.

## 2021-10-29 NOTE — Assessment & Plan Note (Signed)
NSR on exam today. Patient is going to follow up with wife's cardiologist

## 2021-12-22 ENCOUNTER — Other Ambulatory Visit: Payer: Self-pay | Admitting: Student

## 2021-12-22 DIAGNOSIS — J42 Unspecified chronic bronchitis: Secondary | ICD-10-CM

## 2022-04-21 ENCOUNTER — Other Ambulatory Visit: Payer: Self-pay | Admitting: Student

## 2022-04-21 DIAGNOSIS — J42 Unspecified chronic bronchitis: Secondary | ICD-10-CM

## 2022-10-26 ENCOUNTER — Telehealth: Payer: Self-pay

## 2022-10-26 NOTE — Telephone Encounter (Signed)
Attempted to reach patient to schedule physical . Aquilla Solian, CMA

## 2022-10-26 NOTE — Telephone Encounter (Signed)
-----   Message from Levin Erp, MD sent at 10/26/2022  9:06 AM EDT ----- Can you schedule patient for physical? Thanks!

## 2022-11-10 NOTE — Telephone Encounter (Signed)
2nd attempt to reach patient. No answer. LVM for patient to call the office to make appt for physical with Dr. Laroy Apple. Aquilla Solian, CMA

## 2023-02-02 ENCOUNTER — Encounter: Payer: Self-pay | Admitting: Student

## 2023-02-02 ENCOUNTER — Ambulatory Visit: Payer: 59 | Admitting: Student

## 2023-02-02 VITALS — BP 141/101 | HR 69 | Ht 76.0 in | Wt 212.6 lb

## 2023-02-02 DIAGNOSIS — J42 Unspecified chronic bronchitis: Secondary | ICD-10-CM | POA: Diagnosis not present

## 2023-02-02 DIAGNOSIS — Z23 Encounter for immunization: Secondary | ICD-10-CM

## 2023-02-02 DIAGNOSIS — I77819 Aortic ectasia, unspecified site: Secondary | ICD-10-CM | POA: Diagnosis not present

## 2023-02-02 DIAGNOSIS — R030 Elevated blood-pressure reading, without diagnosis of hypertension: Secondary | ICD-10-CM | POA: Insufficient documentation

## 2023-02-02 DIAGNOSIS — R7303 Prediabetes: Secondary | ICD-10-CM

## 2023-02-02 DIAGNOSIS — Z720 Tobacco use: Secondary | ICD-10-CM

## 2023-02-02 LAB — POCT GLYCOSYLATED HEMOGLOBIN (HGB A1C): Hemoglobin A1C: 5.9 % — AB (ref 4.0–5.6)

## 2023-02-02 MED ORDER — ANORO ELLIPTA 62.5-25 MCG/ACT IN AEPB
1.0000 | INHALATION_SPRAY | Freq: Every day | RESPIRATORY_TRACT | 3 refills | Status: DC
Start: 2023-02-02 — End: 2023-02-04

## 2023-02-02 NOTE — Assessment & Plan Note (Signed)
Patient's blood pressure is not controlled today. BP: (!) 141/101. Goal of 130/80. No medications or history HTN diagnosis. Discussed the concern for stroke/heart attacks with BP especially alongside smoking -Follow up in 4 weeks for BP recheck

## 2023-02-02 NOTE — Assessment & Plan Note (Signed)
50 pack years.  He would be a good candidate for a low-dose CT lung cancer screening.  He denies wanting this done though. -Continue to reassess at annual visits

## 2023-02-02 NOTE — Progress Notes (Signed)
SUBJECTIVE:   Chief compliant/HPI: annual examination  Jeremiah Carson is a 72 y.o. who presents today for an annual exam.   Aortic dilation Echo 2018 mild aneurysmal dilatation of the ascending aorta measuring 41 mm. Has not followed up with cardiologist.  Denies any chest pain or dyspnea on exertion.  Does have some dyspnea related to his COPD per patient.  He is not had repeat ultrasound since then.  Smoking Smoking 1 PPD since 1974.  States he had previously smoked marijuana but no longer smokes.  He has quit a few times before related to his cigarettes however he states that he is not ready currently  No alcohol  COPD Anoro ellipta 1 puff in evening.  Albuterol as needed.  States that he has been on Trelegy before and says that it caused him to have a rash in his lower extremity.  States that the [employer] works really well at nighttime but it fades away during the day.  OBJECTIVE:   BP (!) 151/94   Pulse 69   Ht 6\' 4"  (1.93 m)   Wt 212 lb 9.6 oz (96.4 kg)   SpO2 96%   BMI 25.88 kg/m   General: Well appearing, NAD, awake, alert, responsive to questions Head: Normocephalic atraumatic, oropharynx clear, mild nasal congestion, no cervical adenopathy or thyromegaly CV: Regular rate and rhythm no murmurs rubs or gallops Respiratory: Upper airway wheezing present in posterior lungs, chest rises symmetrically,  no increased work of breathing Abdomen: Soft, non-tender, non-distended, normoactive bowel sounds  Extremities: Moves upper and lower extremities freely, no edema in LE Neuro: No focal deficits Skin: No rashes or lesions visualized  ASSESSMENT/PLAN:   No problem-specific Assessment & Plan notes found for this encounter.   Assessment & Plan Prediabetes A1c elevated to 5.9 today.   -Discussed with patient about nutritional changes to make. Aortic dilatation (HCC) Echo 2018 mild aneurysmal dilatation of the ascending aorta measuring 41 mm.  Does have some  dyspnea on exertion that he relates to his COPD.  He denies wanting to follow-up with a cardiologist.  He says that he may follow-up with the cardiologist that his wife is seeing.  He states that he does not want an ultrasound for evaluation of his aorta.  We discussed the risks of aortic dilation and rupture.   -I provided signs and symptoms to look out for to return to care. Chronic bronchitis, unspecified chronic bronchitis type (HCC) Anoro Ellipta helping mostly at nighttime but does not last throughout the whole day.  He has tried Trelegy before but states that he did not like it.  We discussed Breztri but he is not interested. -Continue anoro ellipta -Albuterol PRN Encounter for immunization Flu shot given Tobacco abuse 50 pack years.  He would be a good candidate for a low-dose CT lung cancer screening.  He denies wanting this done though. -Continue to reassess at annual visits Elevated blood pressure reading Patient's blood pressure is not controlled today. BP: (!) 141/101. Goal of 130/80. No medications or history HTN diagnosis. Discussed the concern for stroke/heart attacks with BP especially alongside smoking -Follow up in 4 weeks for BP recheck   Annual Examination  See AVS for age appropriate recommendations.     02/02/2023    2:26 PM 10/28/2021    3:11 PM 08/29/2020    3:39 PM 02/01/2019    3:15 PM 12/15/2017    2:28 PM  Depression screen PHQ 2/9  Decreased Interest 0 0 0 0  0  Down, Depressed, Hopeless 0 0 0 0 0  PHQ - 2 Score 0 0 0 0 0  Altered sleeping 0 0 0    Tired, decreased energy 0 0 0    Change in appetite 0 0 0    Feeling bad or failure about yourself  0 0 0    Trouble concentrating 0 0 0    Moving slowly or fidgety/restless 0 0 0    Suicidal thoughts 0 0 0    PHQ-9 Score 0 0 0    Difficult doing work/chores Not difficult at all Not difficult at all       Blood pressure value is not at goal, discussed.   Considered the following screening exams based upon  USPSTF recommendations: Diabetes screening: ordered Screening for elevated cholesterol: discussed, patient declines---he had elevated lipid panel 2 years ago but did not want to start statin at cardiology office Colorectal cancer screening: discussed and declined today.  He does not want to do a colonoscopy.  He would rather do another Cologuard next year.  Last one was 2 years ago which was negative.  We discussed the risks of not getting a colonoscopy in detail and he is aware. Lung cancer screening: declined See documentation below regarding discussion and indication.   Follow up in 1 month for BP recheck   Levin Erp, MD [facility]

## 2023-02-02 NOTE — Patient Instructions (Addendum)
It was great to see you! Thank you for allowing me to participate in your care!   Our plans for today:  - Let us know if help with smoking - Let me know if you want me to do a CT scan - 1 month follow up if your blood pressure is high - Cardiology follow up - Cologaurd next year - Symptoms from aortic aneurysm: A pulsating feeling in the abdomen Persistent back pain Internal bleeding Dull chest pain Shortness of breath Chest pain during exercise  Take care and seek immediate care sooner if you develop any concerns.  Levin Erp, MD

## 2023-02-02 NOTE — Assessment & Plan Note (Signed)
Anoro Ellipta helping mostly at nighttime but does not last throughout the whole day.  He has tried Trelegy before but states that he did not like it.  We discussed Breztri but he is not interested. -Continue anoro ellipta -Albuterol PRN

## 2023-02-02 NOTE — Assessment & Plan Note (Signed)
Echo 2018 mild aneurysmal dilatation of the ascending aorta measuring 41 mm.  Does have some dyspnea on exertion that he relates to his COPD.  He denies wanting to follow-up with a cardiologist.  He says that he may follow-up with the cardiologist that his wife is seeing.  He states that he does not want an ultrasound for evaluation of his aorta.  We discussed the risks of aortic dilation and rupture.   -I provided signs and symptoms to look out for to return to care.

## 2023-02-04 MED ORDER — ANORO ELLIPTA 62.5-25 MCG/ACT IN AEPB: 1.0000 | INHALATION_SPRAY | Freq: Every day | RESPIRATORY_TRACT | 3 refills | Status: DC

## 2023-02-04 NOTE — Telephone Encounter (Signed)
Called and canceled prescription at [facility]. Resent to [facility] per patient request.   Veronda Prude, RN

## 2023-09-28 ENCOUNTER — Encounter: Payer: Self-pay | Admitting: *Deleted

## 2024-02-10 ENCOUNTER — Other Ambulatory Visit: Payer: Self-pay

## 2024-02-10 DIAGNOSIS — J42 Unspecified chronic bronchitis: Secondary | ICD-10-CM

## 2024-02-10 MED ORDER — UMECLIDINIUM-VILANTEROL 62.5-25 MCG/ACT IN AEPB
1.0000 | INHALATION_SPRAY | Freq: Every day | RESPIRATORY_TRACT | 3 refills | Status: DC
Start: 2024-02-10 — End: 2024-02-11

## 2024-02-11 MED ORDER — UMECLIDINIUM-VILANTEROL 62.5-25 MCG/ACT IN AEPB: 1.0000 | INHALATION_SPRAY | Freq: Every day | RESPIRATORY_TRACT | 3 refills | Status: DC

## 2024-02-11 NOTE — Telephone Encounter (Signed)
 Patient's wife LVM on nurse line regarding Anoro inhaler.   Wife reports that this was supposed to be sent to [facility].   Called and cancelled at [facility], resent to [facility].   Chiquita JAYSON English, RN

## 2024-02-11 NOTE — Addendum Note (Signed)
 Addended by: Aaren Krog C on: 02/11/2024 04:25 PM   Modules accepted: Orders

## 2024-03-06 ENCOUNTER — Encounter: Payer: Self-pay | Admitting: Family Medicine

## 2024-03-06 ENCOUNTER — Ambulatory Visit (INDEPENDENT_AMBULATORY_CARE_PROVIDER_SITE_OTHER): Admitting: Family Medicine

## 2024-03-06 VITALS — BP 138/87 | HR 82 | Ht 76.0 in | Wt 209.8 lb

## 2024-03-06 DIAGNOSIS — R7303 Prediabetes: Secondary | ICD-10-CM | POA: Diagnosis not present

## 2024-03-06 DIAGNOSIS — I480 Paroxysmal atrial fibrillation: Secondary | ICD-10-CM

## 2024-03-06 DIAGNOSIS — J449 Chronic obstructive pulmonary disease, unspecified: Secondary | ICD-10-CM

## 2024-03-06 DIAGNOSIS — Z720 Tobacco use: Secondary | ICD-10-CM | POA: Diagnosis not present

## 2024-03-06 DIAGNOSIS — I77819 Aortic ectasia, unspecified site: Secondary | ICD-10-CM

## 2024-03-06 DIAGNOSIS — J42 Unspecified chronic bronchitis: Secondary | ICD-10-CM

## 2024-03-06 LAB — POCT GLYCOSYLATED HEMOGLOBIN (HGB A1C): HbA1c, POC (prediabetic range): 6.1 % (ref 5.7–6.4)

## 2024-03-06 MED ORDER — UMECLIDINIUM-VILANTEROL 62.5-25 MCG/ACT IN AEPB: 1.0000 | INHALATION_SPRAY | Freq: Every day | RESPIRATORY_TRACT | 3 refills | Status: AC

## 2024-03-06 MED ORDER — ALBUTEROL SULFATE HFA 108 (90 BASE) MCG/ACT IN AERS: 2.0000 | INHALATION_SPRAY | Freq: Four times a day (QID) | RESPIRATORY_TRACT | 5 refills | Status: AC | PRN

## 2024-03-06 NOTE — Assessment & Plan Note (Signed)
 Patient states he was previously on anticoagulation but stopped years ago.  He is not in A-fib today.  He is not interested in being on anticoagulation.

## 2024-03-06 NOTE — Assessment & Plan Note (Signed)
 Echo in 2018 showed mild aneurysmal dilatation, discussed that he needs an ultrasound for surveillance patient declines.  Discussed risk of no ultrasound surveillance

## 2024-03-06 NOTE — Patient Instructions (Addendum)
 Good to see you today - Thank you for coming in  Things we discussed today:  You are prediabetic.  I have attached some information about this that you can read and work on your diet.  I highly recommend quitting smoking.  I also recommend getting a lung cancer screening and an ultrasound to monitor your aortic dilation. If you change your mind about any of the screenings please let me know.  You need to stop smoking!  Call [place]  [facility] (1-800-QUIT-NOW) for ideas  Choose a quit date when you will stop completely.  Get prepared by slowly cutting down.  Find a substitute to use when you think you need a cigarette.  If you wish to discuss nicotine replacement (patches, gum) please make an appointment

## 2024-03-06 NOTE — Assessment & Plan Note (Signed)
 Refilled Anoro Ellipta  and as needed albuterol 

## 2024-03-06 NOTE — Progress Notes (Signed)
    SUBJECTIVE:   Chief compliant/HPI: annual examination  Jeremiah Carson is a 73 y.o. who presents today for an annual exam.   History tabs reviewed and updated.   Review of systems form reviewed and notable for none.   Smoking since 1970 - 1PPD x40 years Last Cologuard 2022  OBJECTIVE:   BP 138/87   Pulse 82   Ht 6' 4 (1.93 m)   Wt 209 lb 12.8 oz (95.2 kg)   SpO2 96%   BMI 25.54 kg/m   General: well appearing, NAD Cardiovascular: RRR, no m/r/g Respiratory: normal work of breathing on RA, CTAB Abdomen: Normal bowel sounds, non-distended  ASSESSMENT/PLAN:   Assessment & Plan Prediabetes A1c 6.1 today.  Discussed lifestyle modifications Chronic obstructive pulmonary disease, unspecified COPD type (HCC) Refilled Anoro Ellipta  and as needed albuterol  Tobacco abuse Discussed smoking cessation.  Provided resources.  Patient not interested in medical assistance with that at this time Aortic dilatation Echo in 2018 showed mild aneurysmal dilatation, discussed that he needs an ultrasound for surveillance patient declines.  Discussed risk of no ultrasound surveillance  PAF (paroxysmal atrial fibrillation) (HCC) Patient states he was previously on anticoagulation but stopped years ago.  He is not in A-fib today.  He is not interested in being on anticoagulation. Annual Examination  See AVS for age appropriate recommendations.  PHQ score 0, reviewed and discussed.  Blood pressure value is at goal, discussed.   Considered the following screening exams based upon USPSTF recommendations: Diabetes screening: ordered HIV testing:n/a Hepatitis C: negative 2020 Hepatitis B:n/a Syphilis if at high risk: not high risk GC/CT not at high risk and not ordered. Lipid panel (nonfasting or fasting) discussed based upon AHA recommendations and patient declined.  Consider repeat every 4-6 years.  Reviewed risk factors for latent tuberculosis and not indicated  Cancer Screening  Discussion  Lung cancer screening:discussed and declined .  See documentation below regarding indications/risks/benefits.  Colorectal cancer screening: discussed and declined today. SABRA  PSA discussed and after engaging in discussion of possible risks, benefits and complications of screening. PSA: not indicated Vaccinations none.   Follow up in 1 year or sooner if indicated.  MyChart Activation: Already signed up   Elyce Prescott, DO [place] [facility]

## 2024-03-06 NOTE — Assessment & Plan Note (Signed)
 Discussed smoking cessation.  Provided resources.  Patient not interested in medical assistance with that at this time
